# Patient Record
Sex: Female | Born: 1955 | Race: White | Hispanic: No | Smoking: Never smoker
Health system: Southern US, Community
[De-identification: ages and names within clinical notes are randomized; demographics above are authoritative.]

## PROBLEM LIST (undated history)

## (undated) DIAGNOSIS — I10 Essential (primary) hypertension: Secondary | ICD-10-CM

## (undated) DIAGNOSIS — K219 Gastro-esophageal reflux disease without esophagitis: Secondary | ICD-10-CM

## (undated) DIAGNOSIS — J45909 Unspecified asthma, uncomplicated: Secondary | ICD-10-CM

## (undated) DIAGNOSIS — C801 Malignant (primary) neoplasm, unspecified: Secondary | ICD-10-CM

## (undated) HISTORY — PX: CYST REMOVAL NECK: SHX6281

---

## 2007-08-29 DIAGNOSIS — C801 Malignant (primary) neoplasm, unspecified: Secondary | ICD-10-CM

## 2007-08-29 HISTORY — DX: Malignant (primary) neoplasm, unspecified: C80.1

## 2007-09-09 HISTORY — PX: BREAST LUMPECTOMY: SHX2

## 2017-11-22 ENCOUNTER — Other Ambulatory Visit: Payer: Self-pay | Admitting: Family Medicine

## 2017-11-22 DIAGNOSIS — Z139 Encounter for screening, unspecified: Secondary | ICD-10-CM

## 2017-12-05 ENCOUNTER — Other Ambulatory Visit: Payer: Self-pay | Admitting: Family Medicine

## 2017-12-05 DIAGNOSIS — Z139 Encounter for screening, unspecified: Secondary | ICD-10-CM

## 2017-12-26 ENCOUNTER — Ambulatory Visit
Admission: RE | Admit: 2017-12-26 | Discharge: 2017-12-26 | Disposition: A | Discharge status: Home / Self Care | Payer: No Typology Code available for payment source | Source: Ambulatory Visit | Attending: Family Medicine | Admitting: Family Medicine

## 2017-12-26 DIAGNOSIS — Z139 Encounter for screening, unspecified: Secondary | ICD-10-CM

## 2017-12-26 HISTORY — DX: Malignant (primary) neoplasm, unspecified: C80.1

## 2018-11-11 ENCOUNTER — Other Ambulatory Visit: Payer: Self-pay | Admitting: Family Medicine

## 2018-11-11 DIAGNOSIS — N6310 Unspecified lump in the right breast, unspecified quadrant: Secondary | ICD-10-CM

## 2018-11-12 ENCOUNTER — Other Ambulatory Visit (HOSPITAL_COMMUNITY): Payer: Self-pay | Admitting: *Deleted

## 2018-11-12 DIAGNOSIS — N6310 Unspecified lump in the right breast, unspecified quadrant: Secondary | ICD-10-CM

## 2018-11-12 DIAGNOSIS — Z853 Personal history of malignant neoplasm of breast: Secondary | ICD-10-CM

## 2018-11-20 ENCOUNTER — Other Ambulatory Visit (HOSPITAL_COMMUNITY): Payer: Self-pay | Admitting: *Deleted

## 2018-11-20 ENCOUNTER — Encounter (HOSPITAL_COMMUNITY): Payer: Self-pay | Admitting: *Deleted

## 2018-11-20 ENCOUNTER — Ambulatory Visit (HOSPITAL_COMMUNITY)
Admission: RE | Admit: 2018-11-20 | Discharge: 2018-11-20 | Disposition: A | Discharge status: Home / Self Care | Payer: Self-pay | Source: Ambulatory Visit | Attending: Obstetrics and Gynecology | Admitting: Obstetrics and Gynecology

## 2018-11-20 VITALS — BP 130/82 | Wt 169.0 lb

## 2018-11-20 DIAGNOSIS — N6320 Unspecified lump in the left breast, unspecified quadrant: Secondary | ICD-10-CM

## 2018-11-20 DIAGNOSIS — N6310 Unspecified lump in the right breast, unspecified quadrant: Secondary | ICD-10-CM

## 2018-11-20 DIAGNOSIS — N6315 Unspecified lump in the right breast, overlapping quadrants: Secondary | ICD-10-CM

## 2018-11-20 DIAGNOSIS — Z01419 Encounter for gynecological examination (general) (routine) without abnormal findings: Secondary | ICD-10-CM

## 2018-11-20 HISTORY — DX: Essential (primary) hypertension: I10

## 2018-11-20 NOTE — Progress Notes (Addendum)
Complaints of a right breast lump since December 2019 that is painful when touched. Patient rates the pain at a 1 out of 10. Patient has a history of left breast cancer and a partial left breast mastectomy in 2009.  Pap Smear: Pap smear completed today. Last Pap smear was in 2016 in [place] and normal per patient. Per patient has no history of an abnormal Pap smear. No Pap smear results are in Epic.  Physical exam: Breasts Right breast larger than left breast due to history of partial mastectomy in 2009. No skin abnormalities right breast. Scar observed right breast from history of left breast partial mastectomy. No nipple retraction bilateral breasts. No nipple discharge bilateral breasts. No lymphadenopathy. Palpated a lump within the left breast under nipple. Palpated a lump within the right breast at 9 o'clock 3 cm from the nipple. Complaints of tenderness when palpated lump within the right breast. Referred patient to the [facility] for a diagnostic mammogram and bilateral breast ultrasound. Appointment scheduled for Friday, November 21, 2018 at 0910.        Pelvic/Bimanual   Ext Genitalia No lesions, no swelling and no discharge observed on external genitalia.         Vagina Vagina pink and normal texture. No lesions or discharge observed in vagina.          Cervix Cervix is present. Cervix pink and of normal texture. No discharge observed.     Uterus Uterus is present and palpable. Uterus is prolapsed and normal size.        Adnexae Bilateral ovaries present and palpable. No tenderness on palpation.         Rectovaginal No rectal exam completed today since patient had no rectal complaints. No skin abnormalities observed on exam.    Smoking History: Patient has never smoked.  Patient Navigation: Patient education provided. Access to services provided for patient through [facility].   Colorectal Cancer Screening: Per patient had a colonoscopy completed 8  years ago. No complaints today. FIT Test given to patient to complete and return to [facility].  Breast and Cervical Cancer Risk Assessment: Patient has a family history of her mother and a maternal aunt having breast cancer. Patient has a personal history of breast cancer in 2009 that a left breast partial mastectomy, radiation, and tamoxifen were completed for treatment. Patient has no known genetic mutations or history radiation treatment to the chest before age 29. Patient has no history of cervical dysplasia, immunocompromised, or DES exposure in-utero.  Risk Assessment    Risk Scores      11/20/2018   Last edited by: Armond Hang, LPN   5-year risk: 4.6 %   Lifetime risk: 18.5 %

## 2018-11-20 NOTE — Addendum Note (Signed)
Encounter addended by: Loletta Parish, RN on: 11/20/2018 4:16 PM  Actions taken: Clinical Note Signed

## 2018-11-20 NOTE — Addendum Note (Signed)
Encounter addended by: Loletta Parish, RN on: 11/20/2018 2:14 PM  Actions taken: Clinical Note Signed

## 2018-11-20 NOTE — Patient Instructions (Addendum)
Explained breast self awareness with [name]. Let patient know [facility] will cover Pap smears and HPV typing every 5 years unless has a history of abnormal Pap smears. Referred patient to the [facility] for a diagnostic mammogram and bilateral breast ultrasound. Appointment scheduled for Friday, November 21, 2018 at 0910. Patient aware of appointment and will be there. Let patient know will follow up with her within the next couple weeks with results of Pap smear by letter or phone. Gloria Mitchell verbalized understanding.  Brannock, Arvil Chaco, RN 12:36 PM

## 2018-11-21 ENCOUNTER — Ambulatory Visit
Admission: RE | Admit: 2018-11-21 | Discharge: 2018-11-21 | Disposition: A | Discharge status: Home / Self Care | Payer: No Typology Code available for payment source | Source: Ambulatory Visit | Attending: Obstetrics and Gynecology | Admitting: Obstetrics and Gynecology

## 2018-11-21 ENCOUNTER — Other Ambulatory Visit (HOSPITAL_COMMUNITY): Payer: Self-pay | Admitting: Obstetrics and Gynecology

## 2018-11-21 DIAGNOSIS — N6310 Unspecified lump in the right breast, unspecified quadrant: Secondary | ICD-10-CM

## 2018-11-21 DIAGNOSIS — N6320 Unspecified lump in the left breast, unspecified quadrant: Secondary | ICD-10-CM

## 2018-11-21 DIAGNOSIS — Z853 Personal history of malignant neoplasm of breast: Secondary | ICD-10-CM

## 2018-11-23 ENCOUNTER — Other Ambulatory Visit: Payer: Self-pay

## 2018-11-25 LAB — CYTOLOGY - PAP
Diagnosis: NEGATIVE
HPV: NOT DETECTED

## 2018-12-01 ENCOUNTER — Ambulatory Visit
Admission: RE | Admit: 2018-12-01 | Discharge: 2018-12-01 | Disposition: A | Discharge status: Home / Self Care | Payer: No Typology Code available for payment source | Source: Ambulatory Visit | Attending: Obstetrics and Gynecology | Admitting: Obstetrics and Gynecology

## 2018-12-01 ENCOUNTER — Other Ambulatory Visit (HOSPITAL_COMMUNITY): Payer: Self-pay | Admitting: Obstetrics and Gynecology

## 2018-12-01 DIAGNOSIS — Z853 Personal history of malignant neoplasm of breast: Secondary | ICD-10-CM

## 2018-12-01 DIAGNOSIS — N6320 Unspecified lump in the left breast, unspecified quadrant: Secondary | ICD-10-CM

## 2018-12-01 DIAGNOSIS — N6310 Unspecified lump in the right breast, unspecified quadrant: Secondary | ICD-10-CM

## 2018-12-04 ENCOUNTER — Encounter: Payer: Self-pay | Admitting: *Deleted

## 2018-12-07 LAB — FECAL OCCULT BLOOD, IMMUNOCHEMICAL: FECAL OCCULT BLD: NEGATIVE

## 2018-12-08 ENCOUNTER — Encounter (HOSPITAL_COMMUNITY): Payer: Self-pay

## 2018-12-08 ENCOUNTER — Other Ambulatory Visit: Payer: Self-pay | Admitting: *Deleted

## 2018-12-08 DIAGNOSIS — Z170 Estrogen receptor positive status [ER+]: Principal | ICD-10-CM

## 2018-12-08 DIAGNOSIS — C50411 Malignant neoplasm of upper-outer quadrant of right female breast: Secondary | ICD-10-CM | POA: Insufficient documentation

## 2018-12-09 ENCOUNTER — Other Ambulatory Visit: Payer: Self-pay | Admitting: Genetics

## 2018-12-09 DIAGNOSIS — C50411 Malignant neoplasm of upper-outer quadrant of right female breast: Secondary | ICD-10-CM

## 2018-12-09 DIAGNOSIS — Z170 Estrogen receptor positive status [ER+]: Principal | ICD-10-CM

## 2018-12-09 NOTE — Progress Notes (Signed)
[facility]  [phone] [phone]     ID: Gloria Mitchell DOB: 1956/01/29  MR#: [id number]  NOM#:[id number]  Patient Care Team: Patient, No Pcp Per as PCP - General (General Practice) Gloria Bookbinder, MD as Consulting Physician (General Surgery) Gloria Mitchell, Gloria Dad, MD as Consulting Physician (Oncology) Gloria Gibson, MD as Attending Physician (Radiation Oncology) Gloria Germany, RN as Oncology Nurse Navigator Gloria Kaufmann, RN as Oncology Nurse Navigator OTHER MD:   CHIEF COMPLAINT: Estrogen receptor positive breast cancer   CURRENT TREATMENT: Awaiting definitive surgery   HISTORY OF CURRENT ILLNESS: Gloria Mitchell has a previous diagnosis of DCISin the left breast treated  in 2009 with a partial mastectomy, and radiation, then tamoxifen for 5 years.   More recently she presented with a palpable abnormality in the upper-outer right breast and in the upper-central left periareolar region. She underwent bilateral diagnostic mammography with tomography and bilateral breast ultrasonography at [facility] on 11/21/2018 showing: Breast Density Category C. In the right breast, examination demonstrates an oval circumscribed 4.4 cm mass over the upper outer region accounting for patient's palpable abnormality. Remainder of the right breast in unchanged. On physical exam, there is an oval 3.0 x 4.0 cm firm mobile mass over the 9:30 position of the right breast 7 cm from the nipple. Target ultrasound is performed, showing an oval predominately circumscribed isoechoic mass with parallel long axis at the 9:30 position of the right breast 7 cm from the nipple corresponding to patient's mammographic and palpable abnormality. This measures 1.9 x 3.3 x 4.3 cm. There is mild to moderate internal vascularity. Ultrasound of the right axilla is unremarkable.   Accordingly on 12/01/2018 she proceeded to biopsy of the right breast area in question. The pathology  from this procedure showed ([id number]): invasive ductal carcinoma, grade 1-2. Prognostic indicators significant for: estrogen receptor, 100% positive and progesterone receptor, 100% positive, both with strong staining intensity. Proliferation marker Ki67 at 10%. HER2 equivocal (2+) by immunohistochemistry but negative by fluorescent in situ hybridization with a signals ratio 1.23 and number per cell 1.85.   The same mammogram showed no definite focal abnormality over the upper central left periareolar region to correspond to patient's palpable abnormality. There are stable post lumpectomy changes over the upper-outer left breast. No focal abnormality over the upper central left periareolar region is palpated on physical exam. Ultrasound over the upper central left periareolar region demonstrates an oval hypoechoic mass at the 1 o'clock position 1 cm from the nipple measuring 0.4 x 0.6 x 0.6 cm with possible intraductal extension. There is subtle border irregularity. Ultrasound of the left axilla is normal.   She also proceeded to biopsy of the left breast area in question on the same day. The pathology from this procedure showed ([id number]): fibrocystic changes to include stromal fibrosis.    The patient's subsequent history is as detailed below.   INTERVAL HISTORY: Gloria Mitchell was evaluated in the multidisciplinary breast cancer clinic on 12/10/2018 accompanied by her sister, Gloria Mitchell. Her case was also presented at the multidisciplinary breast cancer conference on the same day. At that time a preliminary plan was proposed: Genetics testing, definitive surgery, Oncotype and consideration of adjuvant radiation   REVIEW OF SYSTEMS: The patient denies unusual headaches, visual changes, nausea, vomiting, stiff neck, dizziness, or gait imbalance. There has been no cough, phlegm production, or pleurisy, no chest pain or pressure, and no change in bowel or bladder habits. The patient denies fever, rash,  bleeding, unexplained fatigue  or unexplained weight loss. A detailed review of systems was otherwise entirely negative.   PAST MEDICAL HISTORY: Past Medical History:  Diagnosis Date  . Cancer ([place]) 08/29/2007   Left DICS, stage 2  . Hypertension      PAST SURGICAL HISTORY: Past Surgical History:  Procedure Laterality Date  . BREAST LUMPECTOMY Left 09/09/2007  . CYST REMOVAL NECK       FAMILY HISTORY: Family History  Problem Relation Age of Onset  . Hypertension Mother   . Dementia Mother   . Breast cancer Mother        diagnosed in her 58's  . Breast cancer Maternal Aunt    As of February 2020, Gloria Mitchell's father is alive, but she is estranged from him. Gloria Mitchell has no information about her father or his family's medical information. Patients' mother is 17. The patient has 1 brother and 1 sister. The patient has a maternal aunt that was diagnosed with breast cancer at 64 years old. Patient denies anyone in her family having ovarian, prostate, or pancreatic cancer.    GYNECOLOGIC HISTORY:  No LMP recorded. Patient is postmenopausal. 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 She is widowed. She has no children. Gloria Mitchell lives with her mother. Gloria Mitchell does not attend a church, Glass blow[employer]designer, or mosque.   ADVANCED DIRECTIVES: Not in place. Gloria Mitchell was given the appropriate health care power of attorney forms to fill out and return at her convenience.     HEALTH MAINTENANCE: Social History   Tobacco Use  . Smoking status: Never Smoker  . Smokeless tobacco: Never Used  Substance Use Topics  . Alcohol use: Not Currently  . Drug use: Never    Colonoscopy: yes, 12 years ago in [place]  PAP: yes, 11/20/2018  Bone density: yes, unknown date and result   Allergies  Allergen Reactions  . Sulfa Antibiotics     Current Outpatient Medications  Medication Sig  Dispense Refill  . diphenhydrAMINE (BENADRYL) 25 mg capsule Take 25 mg by mouth as needed.    . hydrochlorothiazide (MICROZIDE) 12.5 MG capsule Take 12.5 mg by mouth daily.    Marland Kitchen lisinopril (PRINIVIL,ZESTRIL) 10 MG tablet Take 10 mg by mouth daily.    Marland Kitchen UNABLE TO FIND Zegerid OTC for heartburn PRN     No current facility-administered medications for this visit.      OBJECTIVE: Middle-aged white woman in no acute distress  Vitals:   12/10/18 0900  BP: 137/71  Pulse: 72  Resp: 18  Temp: 98.3 F (36.8 C)  SpO2: 100%     Body mass index is 27.15 kg/m.   Wt Readings from Last 3 Encounters:  12/10/18 168 lb 3.2 oz (76.3 kg)  11/20/18 169 lb (76.7 kg)      ECOG FS:0 - Asymptomatic  Ocular: Sclerae unicteric, pupils round and equal Ear-nose-throat: Oropharynx clear and moist Lymphatic: No cervical or supraclavicular adenopathy Lungs no rales or rhonchi Heart regular rate and rhythm Abd soft, nontender, positive bowel sounds MSK no focal spinal tenderness, no joint edema Neuro: non-focal, well-oriented, appropriate affect Breasts: Status post bilateral breast biopsies.  There is an easily palpable mass in the inferior aspect of the right breast measuring in excess of 3 cm.  There is no skin involvement.  Both axillae are benign.   LAB RESULTS:  CMP     Component Value  Date/Time   NA 142 12/10/2018 0820   K 4.1 12/10/2018 0820   CL 106 12/10/2018 0820   CO2 27 12/10/2018 0820   GLUCOSE 103 (H) 12/10/2018 0820   BUN 18 12/10/2018 0820   CREATININE 0.88 12/10/2018 0820   CALCIUM 9.3 12/10/2018 0820   PROT 7.2 12/10/2018 0820   ALBUMIN 3.9 12/10/2018 0820   AST 13 (L) 12/10/2018 0820   ALT 8 12/10/2018 0820   ALKPHOS 64 12/10/2018 0820   BILITOT 0.3 12/10/2018 0820   GFRNONAA >60 12/10/2018 0820   GFRAA >60 12/10/2018 0820    No results found for: TOTALPROTELP, ALBUMINELP, A1GS, A2GS, BETS, BETA2SER, GAMS, MSPIKE, SPEI  No results found for: KPAFRELGTCHN, LAMBDASER,  KAPLAMBRATIO  Lab Results  Component Value Date   WBC 6.0 12/10/2018   NEUTROABS 3.1 12/10/2018   HGB 12.1 12/10/2018   HCT 38.2 12/10/2018   MCV 93.2 12/10/2018   PLT 288 12/10/2018    '@LASTCHEMISTRY' @  No results found for: LABCA2  No components found for: TRVUYE334  No results for input(s): INR in the last 168 hours.  No results found for: LABCA2  No results found for: DHW861  No results found for: UOH729  No results found for: MSX115  No results found for: CA2729  No components found for: HGQUANT  No results found for: CEA1 / No results found for: CEA1   No results found for: AFPTUMOR  No results found for: CHROMOGRNA  No results found for: PSA1  Appointment on 12/10/2018  Component Date Value Ref Range Status  . Sodium 12/10/2018 142  135 - 145 mmol/L Final  . Potassium 12/10/2018 4.1  3.5 - 5.1 mmol/L Final  . Chloride 12/10/2018 106  98 - 111 mmol/L Final  . CO2 12/10/2018 27  22 - 32 mmol/L Final  . Glucose, Bld 12/10/2018 103* 70 - 99 mg/dL Final  . BUN 12/10/2018 18  8 - 23 mg/dL Final  . Creatinine 12/10/2018 0.88  0.44 - 1.00 mg/dL Final  . Calcium 12/10/2018 9.3  8.9 - 10.3 mg/dL Final  . Total Protein 12/10/2018 7.2  6.5 - 8.1 g/dL Final  . Albumin 12/10/2018 3.9  3.5 - 5.0 g/dL Final  . AST 12/10/2018 13* 15 - 41 U/L Final  . ALT 12/10/2018 8  0 - 44 U/L Final  . Alkaline Phosphatase 12/10/2018 64  38 - 126 U/L Final  . Total Bilirubin 12/10/2018 0.3  0.3 - 1.2 mg/dL Final  . GFR, Est Non Af Am 12/10/2018 >60  >60 mL/min Final  . GFR, Est AFR Am 12/10/2018 >60  >60 mL/min Final  . Anion gap 12/10/2018 9  5 - 15 Final   Performed at [facility], [place] [address] [place], [place] [address]  . WBC Count 12/10/2018 6.0  4.0 - 10.5 K/uL Final  . RBC 12/10/2018 4.10  3.87 - 5.11 MIL/uL Final  . Hemoglobin 12/10/2018 12.1  12.0 - 15.0 g/dL Final  . HCT 12/10/2018 38.2  36.0 - 46.0 % Final  . MCV 12/10/2018 93.2  80.0 -  100.0 fL Final  . MCH 12/10/2018 29.5  26.0 - 34.0 pg Final  . MCHC 12/10/2018 31.7  30.0 - 36.0 g/dL Final  . RDW 12/10/2018 12.9  11.5 - 15.5 % Final  . Platelet Count 12/10/2018 288  150 - 400 K/uL Final  . nRBC 12/10/2018 0.0  0.0 - 0.2 % Final  . Neutrophils Relative % 12/10/2018 50  % Final  . Neutro  Abs 12/10/2018 3.1  1.7 - 7.7 K/uL Final  . Lymphocytes Relative 12/10/2018 37  % Final  . Lymphs Abs 12/10/2018 2.2  0.7 - 4.0 K/uL Final  . Monocytes Relative 12/10/2018 8  % Final  . Monocytes Absolute 12/10/2018 0.5  0.1 - 1.0 K/uL Final  . Eosinophils Relative 12/10/2018 4  % Final  . Eosinophils Absolute 12/10/2018 0.2  0.0 - 0.5 K/uL Final  . Basophils Relative 12/10/2018 1  % Final  . Basophils Absolute 12/10/2018 0.0  0.0 - 0.1 K/uL Final  . Immature Granulocytes 12/10/2018 0  % Final  . Abs Immature Granulocytes 12/10/2018 0.01  0.00 - 0.07 K/uL Final   Performed at [facility], [place] [address] [place], [place] [address]    (this displays the last labs from the last 3 days)  No results found for: TOTALPROTELP, ALBUMINELP, A1GS, A2GS, BETS, BETA2SER, GAMS, MSPIKE, SPEI (this displays SPEP labs)  No results found for: KPAFRELGTCHN, LAMBDASER, KAPLAMBRATIO (kappa/lambda light chains)  No results found for: HGBA, HGBA2QUANT, HGBFQUANT, HGBSQUAN (Hemoglobinopathy evaluation)   No results found for: LDH  No results found for: IRON, TIBC, IRONPCTSAT (Iron and TIBC)  No results found for: FERRITIN  Urinalysis No results found for: COLORURINE, APPEARANCEUR, LABSPEC, PHURINE, GLUCOSEU, HGBUR, BILIRUBINUR, KETONESUR, PROTEINUR, UROBILINOGEN, NITRITE, LEUKOCYTESUR   STUDIES:  US Breast Ltd Uni Left Inc Axilla  Result Date: 11/21/2018 CLINICAL DATA:  Patient presents for a bilateral diagnostic examination due to a palpable abnormality for 1-2 months over the upper-outer right breast. Patient's healthcare provider states a palpable abnormality over  the upper central left periareolar region. Patient has a history of a previous left malignant lumpectomy 2008. Positive family history breast cancer in her mother diagnosed in her 91s as well as maternal aunt. No genetic testing done previously. EXAM: DIGITAL DIAGNOSTIC bilateral MAMMOGRAM WITH CAD AND TOMO ULTRASOUND bilateral BREAST COMPARISON:  Previous exam(s). ACR Breast Density Category c: The breast tissue is heterogeneously dense, which may obscure small masses. FINDINGS: Examination demonstrates an oval circumscribed 4.4 cm mass over the upper outer right breast accounting for patient's palpable abnormality. Remainder of the right breast is unchanged. There is no definite focal abnormality over the upper central left periareolar region to correspond to patient's palpable abnormality. There are stable post lumpectomy changes over the upper-outer left breast. Mammographic images were processed with CAD. On physical exam, there is an oval 3 x 4 cm firm mobile mass over the 9:30 position of the right breast 7 cm from the nipple. I palpate no focal abnormality over the upper central left periareolar region. Targeted ultrasound is performed, showing an oval predominately circumscribed isoechoic mass with parallel long axis at the 9:30 position of the right breast 7 cm from the nipple corresponding to patient's mammographic and palpable abnormality. This measures 1.9 x 3.3 x 4.3 cm. There is mild to moderate internal vascularity. Ultrasound the right axilla is unremarkable. Ultrasound over the upper central left periareolar region demonstrates an oval hypoechoic mass at the 1 o'clock position 1 cm from the nipple measuring 4 x 6 x 6 mm with possible intraductal extension. There is subtle border irregularity. Ultrasound of the left axilla is normal. IMPRESSION: 1. Indeterminate 4.3 cm mass over the 9:30 position of the right breast 7 cm from the nipple corresponding to patient's palpable abnormality. 2.  Indeterminate 6 mm mass over the 1 o'clock position of the left breast 1 cm from the nipple. RECOMMENDATION: Recommend ultrasound-guided Mitchell needle biopsy of these  bilateral indeterminate masses. I have discussed the findings and recommendations with the patient. Results were also provided in writing at the conclusion of the visit. If applicable, a reminder letter will be sent to the patient regarding the next appointment. BI-RADS CATEGORY  4: Suspicious. Biopsy will be scheduled here at the [facility] prior to patient's departure. Electronically Signed   By: Marin Olp M.D.   On: 11/21/2018 11:09   US Breast Ltd Uni Right Inc Axilla  Result Date: 11/21/2018 CLINICAL DATA:  Patient presents for a bilateral diagnostic examination due to a palpable abnormality for 1-2 months over the upper-outer right breast. Patient's healthcare provider states a palpable abnormality over the upper central left periareolar region. Patient has a history of a previous left malignant lumpectomy 2008. Positive family history breast cancer in her mother diagnosed in her 17s as well as maternal aunt. No genetic testing done previously. EXAM: DIGITAL DIAGNOSTIC bilateral MAMMOGRAM WITH CAD AND TOMO ULTRASOUND bilateral BREAST COMPARISON:  Previous exam(s). ACR Breast Density Category c: The breast tissue is heterogeneously dense, which may obscure small masses. FINDINGS: Examination demonstrates an oval circumscribed 4.4 cm mass over the upper outer right breast accounting for patient's palpable abnormality. Remainder of the right breast is unchanged. There is no definite focal abnormality over the upper central left periareolar region to correspond to patient's palpable abnormality. There are stable post lumpectomy changes over the upper-outer left breast. Mammographic images were processed with CAD. On physical exam, there is an oval 3 x 4 cm firm mobile mass over the 9:30 position of the right breast 7 cm from the nipple. I  palpate no focal abnormality over the upper central left periareolar region. Targeted ultrasound is performed, showing an oval predominately circumscribed isoechoic mass with parallel long axis at the 9:30 position of the right breast 7 cm from the nipple corresponding to patient's mammographic and palpable abnormality. This measures 1.9 x 3.3 x 4.3 cm. There is mild to moderate internal vascularity. Ultrasound the right axilla is unremarkable. Ultrasound over the upper central left periareolar region demonstrates an oval hypoechoic mass at the 1 o'clock position 1 cm from the nipple measuring 4 x 6 x 6 mm with possible intraductal extension. There is subtle border irregularity. Ultrasound of the left axilla is normal. IMPRESSION: 1. Indeterminate 4.3 cm mass over the 9:30 position of the right breast 7 cm from the nipple corresponding to patient's palpable abnormality. 2. Indeterminate 6 mm mass over the 1 o'clock position of the left breast 1 cm from the nipple. RECOMMENDATION: Recommend ultrasound-guided Mitchell needle biopsy of these bilateral indeterminate masses. I have discussed the findings and recommendations with the patient. Results were also provided in writing at the conclusion of the visit. If applicable, a reminder letter will be sent to the patient regarding the next appointment. BI-RADS CATEGORY  4: Suspicious. Biopsy will be scheduled here at the [facility] prior to patient's departure. Electronically Signed   By: Marin Olp M.D.   On: 11/21/2018 11:09   Ms Digital Diag Tomo Bilat  Result Date: 11/21/2018 CLINICAL DATA:  Patient presents for a bilateral diagnostic examination due to a palpable abnormality for 1-2 months over the upper-outer right breast. Patient's healthcare provider states a palpable abnormality over the upper central left periareolar region. Patient has a history of a previous left malignant lumpectomy 2008. Positive family history breast cancer in her mother diagnosed in  her 70s as well as maternal aunt. No genetic testing done previously. EXAM: DIGITAL DIAGNOSTIC  bilateral MAMMOGRAM WITH CAD AND TOMO ULTRASOUND bilateral BREAST COMPARISON:  Previous exam(s). ACR Breast Density Category c: The breast tissue is heterogeneously dense, which may obscure small masses. FINDINGS: Examination demonstrates an oval circumscribed 4.4 cm mass over the upper outer right breast accounting for patient's palpable abnormality. Remainder of the right breast is unchanged. There is no definite focal abnormality over the upper central left periareolar region to correspond to patient's palpable abnormality. There are stable post lumpectomy changes over the upper-outer left breast. Mammographic images were processed with CAD. On physical exam, there is an oval 3 x 4 cm firm mobile mass over the 9:30 position of the right breast 7 cm from the nipple. I palpate no focal abnormality over the upper central left periareolar region. Targeted ultrasound is performed, showing an oval predominately circumscribed isoechoic mass with parallel long axis at the 9:30 position of the right breast 7 cm from the nipple corresponding to patient's mammographic and palpable abnormality. This measures 1.9 x 3.3 x 4.3 cm. There is mild to moderate internal vascularity. Ultrasound the right axilla is unremarkable. Ultrasound over the upper central left periareolar region demonstrates an oval hypoechoic mass at the 1 o'clock position 1 cm from the nipple measuring 4 x 6 x 6 mm with possible intraductal extension. There is subtle border irregularity. Ultrasound of the left axilla is normal. IMPRESSION: 1. Indeterminate 4.3 cm mass over the 9:30 position of the right breast 7 cm from the nipple corresponding to patient's palpable abnormality. 2. Indeterminate 6 mm mass over the 1 o'clock position of the left breast 1 cm from the nipple. RECOMMENDATION: Recommend ultrasound-guided Mitchell needle biopsy of these bilateral  indeterminate masses. I have discussed the findings and recommendations with the patient. Results were also provided in writing at the conclusion of the visit. If applicable, a reminder letter will be sent to the patient regarding the next appointment. BI-RADS CATEGORY  4: Suspicious. Biopsy will be scheduled here at the [facility] prior to patient's departure. Electronically Signed   By: Marin Olp M.D.   On: 11/21/2018 11:09   Mm Clip Placement Left  Result Date: 12/01/2018 CLINICAL DATA:  Status post ultrasound-guided Mitchell biopsy of left breast mass EXAM: DIAGNOSTIC LEFT MAMMOGRAM POST ULTRASOUND BIOPSY COMPARISON:  Previous exam(s). FINDINGS: Mammographic images were obtained following left breast ultrasound guided biopsy of question intraductal mass at 1 o'clock. Cc and lateral views of the left breast demonstrate ribbon biopsy clip in the area of concern. IMPRESSION: Post biopsy mammogram demonstrating biopsy clip in the area of concern. Final Assessment: Post Procedure Mammograms for Marker Placement Electronically Signed   By: Abelardo Diesel M.D.   On: 12/01/2018 09:13   Mm Clip Placement Right  Result Date: 12/01/2018 CLINICAL DATA:  Status post ultrasound-guided Mitchell biopsy of right breast mass EXAM: DIAGNOSTIC RIGHT MAMMOGRAM POST ULTRASOUND BIOPSY COMPARISON:  Previous exam(s). FINDINGS: Mammographic images were obtained following right breast ultrasound guided biopsy of mass at 9:30 o'clock. Cc and lateral views of the right breast demonstrate ribbon biopsy clip in the mass of concern. IMPRESSION: Post biopsy mammogram demonstrating ribbon biopsy clip in the mass of concern. Final Assessment: Post Procedure Mammograms for Marker Placement Electronically Signed   By: Abelardo Diesel M.D.   On: 12/01/2018 09:14   [place] Lt Breast Bx W Loc Dev 1st Lesion Img Bx Spec US Guide  Addendum Date: 12/03/2018   ADDENDUM REPORT: 12/03/2018 09:03 ADDENDUM: Pathology revealed FIBROCYSTIC CHANGES TO INCLUDE  STROMAL FIBROSIS of the LEFT breast,  1 o'clock. This was found to be discordant by Dr. Abelardo Diesel, with excision recommended. Pathology revealed GRADE I-II INVASIVE DUCTAL CARCINOMA of the RIGHT breast, 9:30 o'clock. This was found to be concordant by Dr. Abelardo Diesel. Pathology results were discussed with the patient by telephone. The patient reported doing well after the biopsies with tenderness at the sites. Post biopsy instructions and care were reviewed and questions were answered. The patient was encouraged to call [facility] for any additional concerns. The patient was referred to [facility] on December 10, 2018. Pathology results reported by Terie Purser, RN on 12/03/2018. Electronically Signed   By: Abelardo Diesel M.D.   On: 12/03/2018 09:03   Result Date: 12/03/2018 CLINICAL DATA:  Left breast mass for biopsy EXAM: ULTRASOUND GUIDED LEFT BREAST Mitchell NEEDLE BIOPSY COMPARISON:  Previous exam(s). FINDINGS: I met with the patient and we discussed the procedure of ultrasound-guided biopsy, including benefits and alternatives. We discussed the high likelihood of a successful procedure. We discussed the risks of the procedure, including infection, bleeding, tissue injury, clip migration, and inadequate sampling. Informed written consent was given. The usual time-out protocol was performed immediately prior to the procedure. Lesion quadrant: Upper outer quadrant Using sterile technique and 1% Lidocaine as local anesthetic, under direct ultrasound visualization, a 14 gauge spring-loaded device was used to perform biopsy of question intraductal mass at the left breast 1 o'clock using a lateral approach. At the conclusion of the procedure a ribbon tissue marker clip was deployed into the biopsy cavity. Follow up 2 view mammogram was performed and dictated separately. IMPRESSION: Ultrasound guided biopsy of left  breast.  No apparent complications. Electronically Signed: By: Abelardo Diesel M.D. On: 12/01/2018 09:12   [place] Rt Breast Bx W Loc Dev 1st Lesion Img Bx Spec US Guide  Result Date: 12/01/2018 CLINICAL DATA:  Right breast mass for biopsy EXAM: ULTRASOUND GUIDED RIGHT BREAST Mitchell NEEDLE BIOPSY COMPARISON:  Previous exam(s). FINDINGS: I met with the patient and we discussed the procedure of ultrasound-guided biopsy, including benefits and alternatives. We discussed the high likelihood of a successful procedure. We discussed the risks of the procedure, including infection, bleeding, tissue injury, clip migration, and inadequate sampling. Informed written consent was given. The usual time-out protocol was performed immediately prior to the procedure. Lesion quadrant: Upper outer quadrant Using sterile technique and 1% Lidocaine as local anesthetic, under direct ultrasound visualization, a 12 gauge spring-loaded device was used to perform biopsy of homogeneous mass at the right breast 9:30 o'clock using a lateral approach. At the conclusion of the procedure a ribbon tissue marker clip was deployed into the biopsy cavity. Follow up 2 view mammogram was performed and dictated separately. IMPRESSION: Ultrasound guided biopsy of right breast. No apparent complications. Electronically Signed   By: Abelardo Diesel M.D.   On: 12/01/2018 09:11     ELIGIBLE FOR AVAILABLE RESEARCH PROTOCOL: NO   ASSESSMENT: 63 y.o. 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 Specifically we reviewed the biology of the patient's diagnosis and the specifics of her situation.  We first reviewed the fact that cancer is not one disease but more than 100 different diseases and that it is important to keep them separate-- otherwise when friends and relatives discuss their own cancer experiences with [place] confusion can result. Similarly we explained that if breast cancer spreads to the bone or liver, the patient would not have bone cancer or liver cancer, but breast cancer in the bone and breast cancer in the liver: one cancer in three places-- not 3 different cancers which otherwise would have to be treated in 3 different ways.  We discussed the difference between local and systemic therapy. In terms of loco-regional treatment, lumpectomy plus radiation is equivalent to mastectomy as far as survival is concerned. For this reason, and because the cosmetic results are generally superior, we usually recommend breast conserving surgery.  However in Schyler's case it would be difficult to do an adequate resection of the right breast with good cosmesis.  The patient has decided that which she would really prefer is bilateral mastectomies with right-sided sentinel lymph node sampling and that will be the plan.  We then discussed the rationale for systemic therapy. There is some risk that this cancer may have already spread to other parts of her body. Patients frequently ask at this point about bone scans, CAT scans and PET scans to find out if they have occult breast cancer somewhere else. The problem is that in early stage disease we are much more likely to find false positives then true cancers and this would expose the patient to unnecessary procedures as well as unnecessary radiation. Scans cannot answer the  question the patient really would like to know, which is whether she has microscopic disease elsewhere in her body. For those reasons we do not recommend them.  Of course we would proceed to aggressive evaluation of any symptoms that might suggest metastatic disease, but that is not the case here.  Next we went over the options for systemic therapy which are anti-estrogens, anti-HER-2 immunotherapy, and chemotherapy. Vincie does not meet criteria for anti-HER-2 immunotherapy. She is a good candidate for anti-estrogens.  The question of chemotherapy is more complicated. Chemotherapy is most effective in rapidly growing, aggressive tumors. It is much less effective in low-grade, slow growing cancers, like Palak 's. For that reason we are going to request an Oncotype from the definitive surgical sample, as suggested by NCCN guidelines. That will help [place] make a definitive decision regarding chemotherapy in this case.  [place] also qualifies for genetics testing. In patients who carry a deleterious mutation [for example in a  BRCA gene], the risk of a new breast cancer developing in the future may be sufficiently great that the patient may choose bilateral mastectomies. However if she wishes to keep her breasts in that situation it is safe to do so. That would require intensified screening, which generally means not only yearly mammography but a yearly breast MRI as well. Of course, if there is a deleterious mutation bilateral oophorectomy would be necessary as there is no standard screening protocol for ovarian cancer.  The overall plan then is for genetics testing, bilateral mastectomies with no reconstruction, sentinel lymph nodes on the right side, Oncotype testing, and consideration regarding adjuvant radiation depending  on final surgical results.  Once the local therapy has been completed she will start antiestrogens.  Pakou has a good understanding of the overall plan. She agrees with it. She knows the  goal of treatment in her case is cure. She will call with any problems that may develop before her next visit here.   Ann Groeneveld, Gloria Dad, MD  12/10/18 4:52 PM Medical Oncology and Hematology [facility] [address] [place], [place] [address] Tel. [phone]    Fax. [phone]    I, Jacqualyn Posey am acting as a Education administrator for Chauncey Cruel, MD.   I, Lurline Del MD, have reviewed the above documentation for accuracy and completeness, and I agree with the above.

## 2018-12-10 ENCOUNTER — Encounter: Payer: Self-pay | Admitting: Radiation Oncology

## 2018-12-10 ENCOUNTER — Other Ambulatory Visit: Payer: Self-pay | Admitting: General Surgery

## 2018-12-10 ENCOUNTER — Ambulatory Visit
Admission: RE | Admit: 2018-12-10 | Discharge: 2018-12-10 | Disposition: A | Discharge status: Home / Self Care | Payer: Self-pay | Source: Ambulatory Visit | Attending: Radiation Oncology | Admitting: Radiation Oncology

## 2018-12-10 ENCOUNTER — Inpatient Hospital Stay: Payer: Medicaid Other | Attending: Oncology | Admitting: Oncology

## 2018-12-10 ENCOUNTER — Inpatient Hospital Stay: Payer: Medicaid Other

## 2018-12-10 ENCOUNTER — Encounter: Payer: Self-pay | Admitting: Oncology

## 2018-12-10 ENCOUNTER — Other Ambulatory Visit: Payer: Self-pay

## 2018-12-10 ENCOUNTER — Encounter: Payer: Self-pay | Admitting: Physical Therapy

## 2018-12-10 ENCOUNTER — Ambulatory Visit: Payer: Medicaid Other | Attending: General Surgery | Admitting: Physical Therapy

## 2018-12-10 ENCOUNTER — Encounter: Payer: Self-pay | Admitting: *Deleted

## 2018-12-10 VITALS — BP 137/71 | HR 72 | Temp 98.3°F | Resp 18 | Ht 66.0 in | Wt 168.2 lb

## 2018-12-10 DIAGNOSIS — C50411 Malignant neoplasm of upper-outer quadrant of right female breast: Secondary | ICD-10-CM | POA: Diagnosis present

## 2018-12-10 DIAGNOSIS — I10 Essential (primary) hypertension: Secondary | ICD-10-CM | POA: Diagnosis not present

## 2018-12-10 DIAGNOSIS — Z170 Estrogen receptor positive status [ER+]: Principal | ICD-10-CM

## 2018-12-10 DIAGNOSIS — R293 Abnormal posture: Secondary | ICD-10-CM

## 2018-12-10 DIAGNOSIS — Z9011 Acquired absence of right breast and nipple: Secondary | ICD-10-CM | POA: Diagnosis not present

## 2018-12-10 DIAGNOSIS — Z86000 Personal history of in-situ neoplasm of breast: Secondary | ICD-10-CM | POA: Diagnosis not present

## 2018-12-10 LAB — CMP (CANCER CENTER ONLY)
ALT: 8 U/L (ref 0–44)
ANION GAP: 9 (ref 5–15)
AST: 13 U/L — ABNORMAL LOW (ref 15–41)
Albumin: 3.9 g/dL (ref 3.5–5.0)
Alkaline Phosphatase: 64 U/L (ref 38–126)
BUN: 18 mg/dL (ref 8–23)
CO2: 27 mmol/L (ref 22–32)
Calcium: 9.3 mg/dL (ref 8.9–10.3)
Chloride: 106 mmol/L (ref 98–111)
Creatinine: 0.88 mg/dL (ref 0.44–1.00)
GFR, Estimated: >60 mL/min (ref >60)
Glucose, Bld: 103 mg/dL — ABNORMAL HIGH (ref 70–99)
Potassium: 4.1 mmol/L (ref 3.5–5.1)
Sodium: 142 mmol/L (ref 135–145)
TOTAL PROTEIN: 7.2 g/dL (ref 6.5–8.1)
Total Bilirubin: 0.3 mg/dL (ref 0.3–1.2)

## 2018-12-10 LAB — CBC WITH DIFFERENTIAL (CANCER CENTER ONLY)
Abs Immature Granulocytes: 0.01 10*3/uL (ref 0.00–0.07)
Basophils Absolute: 0 10*3/uL (ref 0.0–0.1)
Basophils Relative: 1 %
Eosinophils Absolute: 0.2 10*3/uL (ref 0.0–0.5)
Eosinophils Relative: 4 %
HCT: 38.2 % (ref 36.0–46.0)
Hemoglobin: 12.1 g/dL (ref 12.0–15.0)
Immature Granulocytes: 0 %
Lymphocytes Relative: 37 %
Lymphs Abs: 2.2 10*3/uL (ref 0.7–4.0)
MCH: 29.5 pg (ref 26.0–34.0)
MCHC: 31.7 g/dL (ref 30.0–36.0)
MCV: 93.2 fL (ref 80.0–100.0)
Monocytes Absolute: 0.5 10*3/uL (ref 0.1–1.0)
Monocytes Relative: 8 %
Neutro Abs: 3.1 10*3/uL (ref 1.7–7.7)
Neutrophils Relative %: 50 %
Platelet Count: 288 10*3/uL (ref 150–400)
RBC: 4.1 MIL/uL (ref 3.87–5.11)
RDW: 12.9 % (ref 11.5–15.5)
WBC Count: 6 10*3/uL (ref 4.0–10.5)
nRBC: 0 % (ref 0.0–0.2)

## 2018-12-10 NOTE — Progress Notes (Signed)
Clinical Social Work [place] Psychosocial Distress Screening [place]  Patient completed distress screening protocol and scored a 1 on the Psychosocial Distress Thermometer which indicates mild distress. Clinical Social Worker met with patient and patients sister in [facility] to assess for distress and other psychosocial needs. Patient stated she was felt positive after meeting with the treatment team and getting more information on her treatment plan. CSW and patient discussed common feeling and emotions when being diagnosed with cancer, and the importance of support during treatment. CSW informed patient of the support team and support services at [facility]. CSW provided contact information and encouraged patient to call with any questions or concerns.  ON[facility] DISTRESS SCREENING 12/10/2018  Screening Type Initial Screening  Distress experienced in past week (1-10) 1     Gloria Mitchell, MSW, LCSW, OSW-C Clinical Social Worker [place] [phone]

## 2018-12-10 NOTE — Progress Notes (Signed)
Radiation Oncology         ([phone]) [phone] ________________________________  Initial inpatient Consultation  Name: Gloria Mitchell MRN: [id number]  Date: 12/10/2018  DOB: Oct 08, 1956  WP:[id number], No Pcp Per  No ref. provider found   REFERRING PHYSICIAN: No ref. provider found  DIAGNOSIS:    ICD-10-CM   1. Malignant neoplasm of upper-outer quadrant of right breast in female, estrogen receptor positive ([place]) C50.411    Z17.0    Cancer Staging Malignant neoplasm of upper-outer quadrant of right breast in female, estrogen receptor positive ([place]) Staging form: Breast, AJCC 8th Edition - Clinical stage from 12/10/2018: Stage IB (cT2, cN0, cM0, G2, ER+, PR+, HER2-) - Unsigned   CHIEF COMPLAINT: Here to discuss management of right breast cancer  HISTORY OF PRESENT ILLNESS::Gloria Mitchell is a 63 y.o. female who presented with a palpable right breast mass laterally and then a breast abnormality on the following imaging: mammogram (first one in a while due to lacking health insurance). Ultrasound of breast on right side revealed 4.3cm mass at 9:30 and negative axilla.  Also, left breast, which is s/p lumpectomy several years ago due to breast cancer, showed a 37m mass at 1:00 that was suspicious.   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  ER status: +; PR status +, Her2 status neg; Grade 1-2. Biopsy of left breast showed fibrocystic changes which are felt to be discordant.  The patient reports that her left breast cancer was treated with lumpectomy and about 7 weeks of [place]  I am not sure about what systemic therapy if any she received.  Review of systems is positive for allergies, glasses, heartburn, skin cancer  PREVIOUS RADIATION THERAPY: Yes as above  PAST MEDICAL HISTORY:  has a past medical history of [place]) and Hypertension.    PAST SURGICAL HISTORY: Past Surgical History:  Procedure Laterality  Date  . BREAST LUMPECTOMY Left 09/09/2007  . CYST REMOVAL NECK      FAMILY HISTORY: family history includes Breast cancer in her maternal aunt and mother; Dementia in her mother; Hypertension in her mother.  SOCIAL HISTORY:  reports that she has never smoked. She has never used smokeless tobacco. She reports previous alcohol use. She reports that she does not use drugs.  ALLERGIES: Sulfa antibiotics  MEDICATIONS:  Current Outpatient Medications  Medication Sig Dispense Refill  . diphenhydrAMINE (BENADRYL) 25 mg capsule Take 25 mg by mouth as needed.    . hydrochlorothiazide (MICROZIDE) 12.5 MG capsule Take 12.5 mg by mouth daily.    .Marland Kitchenlisinopril (PRINIVIL,ZESTRIL) 10 MG tablet Take 10 mg by mouth daily.    .Marland KitchenUNABLE TO FIND Zegerid OTC for heartburn PRN     No current facility-administered medications for this encounter.     REVIEW OF SYSTEMS: A 10+ POINT REVIEW OF SYSTEMS WAS OBTAINED including neurology, dermatology, psychiatry, cardiac, respiratory, lymph, extremities, GI, GU, Musculoskeletal, constitutional, breasts, reproductive, HEENT.  All pertinent positives are noted in the HPI.  All others are negative.   PHYSICAL EXAM:  Vitals with BMI 12/10/2018  Height '5\' 6"'   Weight 168 lbs 3 oz  BMI 255.37 Systolic 1482 Diastolic 71  Pulse 72  Respirations 18   General: Alert and oriented, in no acute distress HEENT: Head is normocephalic. Extraocular movements are intact. Oropharynx is clear. Neck: Neck is supple, no palpable cervical or supraclavicular lymphadenopathy. Heart: Regular in rate and rhythm with no murmurs, rubs, or gallops. Chest:  Clear to auscultation bilaterally, with no rhonchi, wheezes, or rales. Abdomen: Soft, nontender, nondistended, with no rigidity or guarding. Extremities: No cyanosis or edema. Lymphatics: see Neck Exam Skin: No concerning lesions. Musculoskeletal: symmetric strength and muscle tone throughout. Neurologic: Cranial nerves II through XII  are grossly intact. No obvious focalities. Speech is fluent. Coordination is intact. Psychiatric: Judgment and insight are intact. Affect is appropriate. Breasts: 2 cm of thickening in the upper outer quadrant of the left breast.  There is a 5 cm mass at 9:00 of the right breast.. No other palpable masses appreciated in the breasts or axillae .  ECOG = 0  0 - Asymptomatic (Fully active, able to carry on all predisease activities without restriction)  1 - Symptomatic but completely ambulatory (Restricted in physically strenuous activity but ambulatory and able to carry out work of a light or sedentary nature. For example, light housework, office work)  2 - Symptomatic, <50% in bed during the day (Ambulatory and capable of all self care but unable to carry out any work activities. Up and about more than 50% of waking hours)  3 - Symptomatic, >50% in bed, but not bedbound (Capable of only limited self-care, confined to bed or chair 50% or more of waking hours)  4 - Bedbound (Completely disabled. Cannot carry on any self-care. Totally confined to bed or chair)  5 - Death   Eustace Pen MM, Creech RH, Tormey DC, et al. [phone]). "Toxicity and response criteria of the [facility]". [place] Oncol. 5 (6): 649-55   LABORATORY DATA:  Lab Results  Component Value Date   WBC 6.0 12/10/2018   HGB 12.1 12/10/2018   HCT 38.2 12/10/2018   MCV 93.2 12/10/2018   PLT 288 12/10/2018   CMP     Component Value Date/Time   NA 142 12/10/2018 0820   K 4.1 12/10/2018 0820   CL 106 12/10/2018 0820   CO2 27 12/10/2018 0820   GLUCOSE 103 (H) 12/10/2018 0820   BUN 18 12/10/2018 0820   CREATININE 0.88 12/10/2018 0820   CALCIUM 9.3 12/10/2018 0820   PROT 7.2 12/10/2018 0820   ALBUMIN 3.9 12/10/2018 0820   AST 13 (L) 12/10/2018 0820   ALT 8 12/10/2018 0820   ALKPHOS 64 12/10/2018 0820   BILITOT 0.3 12/10/2018 0820   GFRNONAA >60 12/10/2018 0820   GFRAA >60 12/10/2018 0820           RADIOGRAPHY: US Breast Ltd Uni Left Inc Axilla  Result Date: 11/21/2018 CLINICAL DATA:  Patient presents for a bilateral diagnostic examination due to a palpable abnormality for 1-2 months over the upper-outer right breast. Patient's healthcare provider states a palpable abnormality over the upper central left periareolar region. Patient has a history of a previous left malignant lumpectomy 2008. Positive family history breast cancer in her mother diagnosed in her 59s as well as maternal aunt. No genetic testing done previously. EXAM: DIGITAL DIAGNOSTIC bilateral MAMMOGRAM WITH CAD AND TOMO ULTRASOUND bilateral BREAST COMPARISON:  Previous exam(s). ACR Breast Density Category c: The breast tissue is heterogeneously dense, which may obscure small masses. FINDINGS: Examination demonstrates an oval circumscribed 4.4 cm mass over the upper outer right breast accounting for patient's palpable abnormality. Remainder of the right breast is unchanged. There is no definite focal abnormality over the upper central left periareolar region to correspond to patient's palpable abnormality. There are stable post lumpectomy changes over the upper-outer left breast. Mammographic images were processed with CAD. On physical exam, there  is an oval 3 x 4 cm firm mobile mass over the 9:30 position of the right breast 7 cm from the nipple. I palpate no focal abnormality over the upper central left periareolar region. Targeted ultrasound is performed, showing an oval predominately circumscribed isoechoic mass with parallel long axis at the 9:30 position of the right breast 7 cm from the nipple corresponding to patient's mammographic and palpable abnormality. This measures 1.9 x 3.3 x 4.3 cm. There is mild to moderate internal vascularity. Ultrasound the right axilla is unremarkable. Ultrasound over the upper central left periareolar region demonstrates an oval hypoechoic mass at the 1 o'clock position 1 cm from the nipple measuring 4 x  6 x 6 mm with possible intraductal extension. There is subtle border irregularity. Ultrasound of the left axilla is normal. IMPRESSION: 1. Indeterminate 4.3 cm mass over the 9:30 position of the right breast 7 cm from the nipple corresponding to patient's palpable abnormality. 2. Indeterminate 6 mm mass over the 1 o'clock position of the left breast 1 cm from the nipple. RECOMMENDATION: Recommend ultrasound-guided core needle biopsy of these bilateral indeterminate masses. I have discussed the findings and recommendations with the patient. Results were also provided in writing at the conclusion of the visit. If applicable, a reminder letter will be sent to the patient regarding the next appointment. BI-RADS CATEGORY  4: Suspicious. Biopsy will be scheduled here at the [facility] prior to patient's departure. Electronically Signed   By: Marin Olp M.D.   On: 11/21/2018 11:09   US Breast Ltd Uni Right Inc Axilla  Result Date: 11/21/2018 CLINICAL DATA:  Patient presents for a bilateral diagnostic examination due to a palpable abnormality for 1-2 months over the upper-outer right breast. Patient's healthcare provider states a palpable abnormality over the upper central left periareolar region. Patient has a history of a previous left malignant lumpectomy 2008. Positive family history breast cancer in her mother diagnosed in her 8s as well as maternal aunt. No genetic testing done previously. EXAM: DIGITAL DIAGNOSTIC bilateral MAMMOGRAM WITH CAD AND TOMO ULTRASOUND bilateral BREAST COMPARISON:  Previous exam(s). ACR Breast Density Category c: The breast tissue is heterogeneously dense, which may obscure small masses. FINDINGS: Examination demonstrates an oval circumscribed 4.4 cm mass over the upper outer right breast accounting for patient's palpable abnormality. Remainder of the right breast is unchanged. There is no definite focal abnormality over the upper central left periareolar region to correspond to  patient's palpable abnormality. There are stable post lumpectomy changes over the upper-outer left breast. Mammographic images were processed with CAD. On physical exam, there is an oval 3 x 4 cm firm mobile mass over the 9:30 position of the right breast 7 cm from the nipple. I palpate no focal abnormality over the upper central left periareolar region. Targeted ultrasound is performed, showing an oval predominately circumscribed isoechoic mass with parallel long axis at the 9:30 position of the right breast 7 cm from the nipple corresponding to patient's mammographic and palpable abnormality. This measures 1.9 x 3.3 x 4.3 cm. There is mild to moderate internal vascularity. Ultrasound the right axilla is unremarkable. Ultrasound over the upper central left periareolar region demonstrates an oval hypoechoic mass at the 1 o'clock position 1 cm from the nipple measuring 4 x 6 x 6 mm with possible intraductal extension. There is subtle border irregularity. Ultrasound of the left axilla is normal. IMPRESSION: 1. Indeterminate 4.3 cm mass over the 9:30 position of the right breast 7 cm from the nipple  corresponding to patient's palpable abnormality. 2. Indeterminate 6 mm mass over the 1 o'clock position of the left breast 1 cm from the nipple. RECOMMENDATION: Recommend ultrasound-guided core needle biopsy of these bilateral indeterminate masses. I have discussed the findings and recommendations with the patient. Results were also provided in writing at the conclusion of the visit. If applicable, a reminder letter will be sent to the patient regarding the next appointment. BI-RADS CATEGORY  4: Suspicious. Biopsy will be scheduled here at the [facility] prior to patient's departure. Electronically Signed   By: Marin Olp M.D.   On: 11/21/2018 11:09   Ms Digital Diag Tomo Bilat  Result Date: 11/21/2018 CLINICAL DATA:  Patient presents for a bilateral diagnostic examination due to a palpable abnormality for 1-2  months over the upper-outer right breast. Patient's healthcare provider states a palpable abnormality over the upper central left periareolar region. Patient has a history of a previous left malignant lumpectomy 2008. Positive family history breast cancer in her mother diagnosed in her 18s as well as maternal aunt. No genetic testing done previously. EXAM: DIGITAL DIAGNOSTIC bilateral MAMMOGRAM WITH CAD AND TOMO ULTRASOUND bilateral BREAST COMPARISON:  Previous exam(s). ACR Breast Density Category c: The breast tissue is heterogeneously dense, which may obscure small masses. FINDINGS: Examination demonstrates an oval circumscribed 4.4 cm mass over the upper outer right breast accounting for patient's palpable abnormality. Remainder of the right breast is unchanged. There is no definite focal abnormality over the upper central left periareolar region to correspond to patient's palpable abnormality. There are stable post lumpectomy changes over the upper-outer left breast. Mammographic images were processed with CAD. On physical exam, there is an oval 3 x 4 cm firm mobile mass over the 9:30 position of the right breast 7 cm from the nipple. I palpate no focal abnormality over the upper central left periareolar region. Targeted ultrasound is performed, showing an oval predominately circumscribed isoechoic mass with parallel long axis at the 9:30 position of the right breast 7 cm from the nipple corresponding to patient's mammographic and palpable abnormality. This measures 1.9 x 3.3 x 4.3 cm. There is mild to moderate internal vascularity. Ultrasound the right axilla is unremarkable. Ultrasound over the upper central left periareolar region demonstrates an oval hypoechoic mass at the 1 o'clock position 1 cm from the nipple measuring 4 x 6 x 6 mm with possible intraductal extension. There is subtle border irregularity. Ultrasound of the left axilla is normal. IMPRESSION: 1. Indeterminate 4.3 cm mass over the 9:30  position of the right breast 7 cm from the nipple corresponding to patient's palpable abnormality. 2. Indeterminate 6 mm mass over the 1 o'clock position of the left breast 1 cm from the nipple. RECOMMENDATION: Recommend ultrasound-guided core needle biopsy of these bilateral indeterminate masses. I have discussed the findings and recommendations with the patient. Results were also provided in writing at the conclusion of the visit. If applicable, a reminder letter will be sent to the patient regarding the next appointment. BI-RADS CATEGORY  4: Suspicious. Biopsy will be scheduled here at the [facility] prior to patient's departure. Electronically Signed   By: Marin Olp M.D.   On: 11/21/2018 11:09   Mm Clip Placement Left  Result Date: 12/01/2018 CLINICAL DATA:  Status post ultrasound-guided core biopsy of left breast mass EXAM: DIAGNOSTIC LEFT MAMMOGRAM POST ULTRASOUND BIOPSY COMPARISON:  Previous exam(s). FINDINGS: Mammographic images were obtained following left breast ultrasound guided biopsy of question intraductal mass at 1 o'clock. Cc and lateral  views of the left breast demonstrate ribbon biopsy clip in the area of concern. IMPRESSION: Post biopsy mammogram demonstrating biopsy clip in the area of concern. Final Assessment: Post Procedure Mammograms for Marker Placement Electronically Signed   By: Abelardo Diesel M.D.   On: 12/01/2018 09:13   Mm Clip Placement Right  Result Date: 12/01/2018 CLINICAL DATA:  Status post ultrasound-guided core biopsy of right breast mass EXAM: DIAGNOSTIC RIGHT MAMMOGRAM POST ULTRASOUND BIOPSY COMPARISON:  Previous exam(s). FINDINGS: Mammographic images were obtained following right breast ultrasound guided biopsy of mass at 9:30 o'clock. Cc and lateral views of the right breast demonstrate ribbon biopsy clip in the mass of concern. IMPRESSION: Post biopsy mammogram demonstrating ribbon biopsy clip in the mass of concern. Final Assessment: Post Procedure Mammograms  for Marker Placement Electronically Signed   By: Abelardo Diesel M.D.   On: 12/01/2018 09:14   [place] Lt Breast Bx W Loc Dev 1st Lesion Img Bx Spec US Guide  Addendum Date: 12/03/2018   ADDENDUM REPORT: 12/03/2018 09:03 ADDENDUM: Pathology revealed FIBROCYSTIC CHANGES TO INCLUDE STROMAL FIBROSIS of the LEFT breast, 1 o'clock. This was found to be discordant by Dr. Abelardo Diesel, with excision recommended. Pathology revealed GRADE I-II INVASIVE DUCTAL CARCINOMA of the RIGHT breast, 9:30 o'clock. This was found to be concordant by Dr. Abelardo Diesel. Pathology results were discussed with the patient by telephone. The patient reported doing well after the biopsies with tenderness at the sites. Post biopsy instructions and care were reviewed and questions were answered. The patient was encouraged to call [facility] for any additional concerns. The patient was referred to [facility] on December 10, 2018. Pathology results reported by Terie Purser, RN on 12/03/2018. Electronically Signed   By: Abelardo Diesel M.D.   On: 12/03/2018 09:03   Result Date: 12/03/2018 CLINICAL DATA:  Left breast mass for biopsy EXAM: ULTRASOUND GUIDED LEFT BREAST CORE NEEDLE BIOPSY COMPARISON:  Previous exam(s). FINDINGS: I met with the patient and we discussed the procedure of ultrasound-guided biopsy, including benefits and alternatives. We discussed the high likelihood of a successful procedure. We discussed the risks of the procedure, including infection, bleeding, tissue injury, clip migration, and inadequate sampling. Informed written consent was given. The usual time-out protocol was performed immediately prior to the procedure. Lesion quadrant: Upper outer quadrant Using sterile technique and 1% Lidocaine as local anesthetic, under direct ultrasound visualization, a 14 gauge spring-loaded device was used to perform biopsy of question  intraductal mass at the left breast 1 o'clock using a lateral approach. At the conclusion of the procedure a ribbon tissue marker clip was deployed into the biopsy cavity. Follow up 2 view mammogram was performed and dictated separately. IMPRESSION: Ultrasound guided biopsy of left breast.  No apparent complications. Electronically Signed: By: Abelardo Diesel M.D. On: 12/01/2018 09:12   [place] Rt Breast Bx W Loc Dev 1st Lesion Img Bx Spec US Guide  Result Date: 12/01/2018 CLINICAL DATA:  Right breast mass for biopsy EXAM: ULTRASOUND GUIDED RIGHT BREAST CORE NEEDLE BIOPSY COMPARISON:  Previous exam(s). FINDINGS: I met with the patient and we discussed the procedure of ultrasound-guided biopsy, including benefits and alternatives. We discussed the high likelihood of a successful procedure. We discussed the risks of the procedure, including infection, bleeding, tissue injury, clip migration, and inadequate sampling. Informed written consent was given. The usual time-out protocol was performed immediately prior to the procedure. Lesion quadrant: Upper outer  quadrant Using sterile technique and 1% Lidocaine as local anesthetic, under direct ultrasound visualization, a 12 gauge spring-loaded device was used to perform biopsy of homogeneous mass at the right breast 9:30 o'clock using a lateral approach. At the conclusion of the procedure a ribbon tissue marker clip was deployed into the biopsy cavity. Follow up 2 view mammogram was performed and dictated separately. IMPRESSION: Ultrasound guided biopsy of right breast. No apparent complications. Electronically Signed   By: Abelardo Diesel M.D.   On: 12/01/2018 09:11      IMPRESSION/PLAN: We discussed her surgical options.  She is also meeting with Dr. Donne Hazel today.  Ultimately, the patient is enthusiastic about undergoing bilateral mastectomies.  Regarding systemic therapy she will discuss Oncotype testing with Dr. Jana Hakim.  We discussed adjuvant radiotherapy which  very well not be needed after mastectomies.  We discussed indications for postmastectomy radiation.  I will see her back PRN for treatment planning, if warranted.  She knows that we will discuss her case adjuvantly at breast tumor board.  I wished her the very best   Genetic counseling referral will be made  __________________________________________   [name], MD

## 2018-12-10 NOTE — Progress Notes (Signed)
Nutrition Assessment  Reason for Assessment:  Pt seen in Breast Clinic  ASSESSMENT:   63 year old female with new diagnosis of breast cancer.  Past medical history of left breast cancer in 2009, HTN.  Patient reports normal appetite.  Medications:  reviewed  Labs: glucose 103  Anthropometrics:   Height: 66 inches Weight: 168 lb 3.2 oz BMI: 27   NUTRITION DIAGNOSIS: Food and nutrition related knowledge deficit related to new diagnosis of breast cancer as evidenced by no prior need for nutrition related information.  INTERVENTION:   Discussed and provided packet of information regarding nutritional tips for breast cancer patients.  Questions answered.  Teachback method used.  Contact information provided and patient knows to contact me with questions/concerns.    MONITORING, EVALUATION, and GOAL: Pt will consume a healthy plant based diet to maintain lean body mass throughout treatment.   Zeah Germano B. Zenia Resides, [place], [place] Registered Dietitian [phone] (pager)

## 2018-12-10 NOTE — Patient Instructions (Signed)

## 2018-12-10 NOTE — Therapy (Addendum)
[place], [place], [address] Phone: [phone]   Fax:  [phone]  Physical Therapy Evaluation  Patient Details  Name: Levita Monical MRN: [id number] Date of Birth: 01/26/1956 Referring Provider (PT): Dr. Rolm Bookbinder   Encounter Date: 12/10/2018  PT End of Session - 12/10/18 1111    Visit Number  1    Number of Visits  2    Date for PT Re-Evaluation  02/04/19    PT Start Time  0957    PT Stop Time  8811   Also saw pt from 1034-1047 for a total of 29 minutes   PT Time Calculation (min)  16 min    Activity Tolerance  Patient tolerated treatment well    Behavior During Therapy  [facility] for tasks assessed/performed       Past Medical History:  Diagnosis Date  . Cancer ([place]) 08/29/2007   Left DICS, stage 2  . Hypertension     Past Surgical History:  Procedure Laterality Date  . BREAST LUMPECTOMY Left 09/09/2007  . CYST REMOVAL NECK      There were no vitals filed for this visit.   Subjective Assessment - 12/10/18 1104    Subjective  Patient reports she is here today to be seen by her medical team for her newly diagnosed right breast cancer.    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 It measures 4.3 cm and is located in the upper outer quadrant. It is ER/PR positive, HER2 negative with a Ki67 of 10%. There is a place on the left breast that is likely scar tissue from previous breast surgery that measures 6 mm and is located in the upper outer quadrant. She has a previous left breast cancer history in 2008 and underwent a left lumpectomy, sentinel node biopsy, radiation, and 5 years of Tamoxifen.    Patient Stated Goals  Reduce lymphedema risk and learn post op shoulder ROM HEP    Currently in Pain?  No/denies         [facility] PT Assessment - 12/10/18 0001       Assessment   Medical Diagnosis  Right breast cancer    Referring Provider (PT)  Dr. Rolm Bookbinder    Onset Date/Surgical Date  11/21/18    Hand Dominance  Right    Prior Therapy  none      Precautions   Precautions  Other (comment)    Precaution Comments  active cancer; left arm lymphedema risk      Restrictions   Weight Bearing Restrictions  No      Balance Screen   Has the patient fallen in the past 6 months  No    Has the patient had a decrease in activity level because of a fear of falling?   No    Is the patient reluctant to leave their home because of a fear of falling?   No      Home Environment   Living Environment  Private residence    Living Arrangements  Parent   64 y.o. mother   Available Help at Discharge  Family      Prior Function   Level of [place]  Retired    Leisure  She walks her dogs several times per day and walks by herself for 30 min about 1x/week  Cognition   Overall Cognitive Status  Within Functional Limits for tasks assessed      Posture/Postural Control   Posture/Postural Control  Postural limitations    Postural Limitations  Rounded Shoulders;Forward head      ROM / Strength   AROM / PROM / Strength  AROM;Strength      AROM   AROM Assessment Site  Shoulder;Cervical    Right/Left Shoulder  Right;Left    Right Shoulder Extension  65 Degrees    Right Shoulder Flexion  150 Degrees    Right Shoulder ABduction  163 Degrees    Right Shoulder Internal Rotation  72 Degrees    Right Shoulder External Rotation  87 Degrees    Left Shoulder Extension  65 Degrees    Left Shoulder Flexion  147 Degrees    Left Shoulder ABduction  159 Degrees    Left Shoulder Internal Rotation  75 Degrees    Left Shoulder External Rotation  76 Degrees    Cervical Flexion  WNL    Cervical Extension  WNL    Cervical - Right Side Bend  WNL    Cervical - Left Side Bend  WNL    Cervical - Right Rotation  WNL    Cervical - Left  Rotation  WNL      Strength   Overall Strength  Within functional limits for tasks performed        LYMPHEDEMA/ONCOLOGY QUESTIONNAIRE - 12/10/18 1110      Type   Cancer Type  Right breast cancer      Lymphedema Assessments   Lymphedema Assessments  Upper extremities      Right Upper Extremity Lymphedema   10 cm Proximal to Olecranon Process  29 cm    Olecranon Process  24.6 cm    10 cm Proximal to Ulnar Styloid Process  21.1 cm    Just Proximal to Ulnar Styloid Process  16.2 cm    Across Hand at [employer]  18.8 cm    At [place] of 2nd Digit  6.6 cm      Left Upper Extremity Lymphedema   10 cm Proximal to Olecranon Process  29.1 cm    Olecranon Process  25 cm    10 cm Proximal to Ulnar Styloid Process  20.7 cm    Just Proximal to Ulnar Styloid Process  16.6 cm    Across Hand at [employer]  18.5 cm    At [place] of 2nd Digit  6.4 cm          Quick Dash - 12/10/18 0001    Open a tight or new jar  No difficulty    Do heavy household chores (wash walls, wash floors)  No difficulty    Carry a shopping bag or briefcase  No difficulty    Wash your back  No difficulty    Use a knife to cut food  No difficulty    Recreational activities in which you take some force or impact through your arm, shoulder, or hand (golf, hammering, tennis)  No difficulty    During the past week, to what extent has your arm, shoulder or hand problem interfered with your normal social activities with family, friends, neighbors, or groups?  Not at all    During the past week, to what extent has your arm, shoulder or hand problem limited your work or other regular daily activities  Not at all    Arm, shoulder, or hand pain.  None  Tingling (pins and needles) in your arm, shoulder, or hand  None    Difficulty Sleeping  No difficulty    DASH Score  0 %        Objective measurements completed on examination: See above findings.        Patient was instructed today in a home exercise  program today for post op shoulder range of motion. These included active assist shoulder flexion in sitting, scapular retraction, wall walking with shoulder abduction, and hands behind head external rotation.  She was encouraged to do these twice a day, holding 3 seconds and repeating 5 times when permitted by her physician.          PT Education - 12/10/18 1110    Education Details  Lymphedema risk reduction and post op shoulder ROM HEP    Person(s) Educated  Patient;Other (comment)   sister   Methods  Explanation;Demonstration;Handout    Comprehension  Returned demonstration;Verbalized understanding          PT Long Term Goals - 12/10/18 1115      PT LONG TERM GOAL #1   Title  Patient will demonstrate she has regained full ROM and function post operatively compared to baseline measurements.    Time  8    Period  Weeks    Status  New      Breast Clinic Goals - 12/10/18 1115      Patient will be able to verbalize understanding of pertinent lymphedema risk reduction practices relevant to her diagnosis specifically related to skin care.   Time  1    Period  Days    Status  Achieved      Patient will be able to return demonstrate and/or verbalize understanding of the post-op home exercise program related to regaining shoulder range of motion.   Time  1    Period  Days    Status  Achieved      Patient will be able to verbalize understanding of the importance of attending the postoperative After Breast Cancer Class for further lymphedema risk reduction education and therapeutic exercise.   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 It measures 4.3 cm and is located in the upper outer quadrant. It is ER/PR positive, HER2 negative with a Ki67 of 10%. There is a place on the left breast that is likely scar  tissue from previous breast surgery that measures 6 mm and is located in the upper outer quadrant. She has a previous left breast cancer history in 2008 and underwent a left lumpectomy, sentinel node biopsy, radiation, and 5 years of Tamoxifen. Her multidisciplinary medical team met prior to her assessments to determine a recommended treatment plan. She is planning to have a bilateral mastectomy and right sentinel node biopsy followed by anti-estrogen therapy. She will benefit from a post op PT visit to reassess and determine needs.    History and Personal Factors relevant to plan of care:  Previous left breast cancer; caregivre for her elderly mother    Clinical Presentation  Stable    Clinical Presentation due to:  Stable condition    Clinical Decision Making  Low    Rehab Potential  Excellent    Clinical Impairments Affecting Rehab Potential  None  PT Frequency  --   Eval and 1 f/u visit   PT Treatment/Interventions  ADLs/Self Care Home Management;Patient/family education;Therapeutic exercise    PT Next Visit Plan  Will reassess 3-4 weeks post op to determine needs    PT Home Exercise Plan  Post op shoulder ROM HEP    Consulted and Agree with Plan of Care  Patient;Family member/caregiver    Family Member Consulted  sister       Patient will benefit from skilled therapeutic intervention in order to improve the following deficits and impairments:  Decreased range of motion, Impaired UE functional use, Pain, Decreased knowledge of precautions, Postural dysfunction  Visit Diagnosis: Malignant neoplasm of upper-outer quadrant of right breast in female, estrogen receptor positive ([place]) - Plan: PT plan of care cert/re-cert  Abnormal posture - Plan: PT plan of care cert/re-cert   Patient will follow up at outpatient cancer rehab 3-4 weeks following surgery.  If the patient requires physical therapy at that time, a specific plan will be dictated and sent to the referring physician for  approval. The patient was educated today on appropriate basic range of motion exercises to begin post operatively and the importance of attending the After Breast Cancer class following surgery.  Patient was educated today on lymphedema risk reduction practices as it pertains to recommendations that will benefit the patient immediately following surgery.  She verbalized good understanding.     Problem List Patient Active Problem List   Diagnosis Date Noted  . Malignant neoplasm of upper-outer quadrant of right breast in female, estrogen receptor positive ([place]) 12/08/2018   Annia Friendly, PT 12/10/18 11:19 AM  [place], [place], [address] Phone: [phone]   Fax:  [phone]  Name: Jericho Cieslik MRN: [id number] Date of Birth: 1955/11/19   PHYSICAL THERAPY DISCHARGE SUMMARY  Visits from Start of Care: 1  Current functional level related to goals / functional outcomes: See above   Remaining deficits: See above   Education / Equipment: HEP  Plan: Patient agrees to discharge.  Patient goals were not met. Patient is being discharged due to not returning since the last visit.  ?????    Pt unable to return during the time frame due to Covid. Pt is now being seen in PT.  Allyson Sabal [place], [place] 04/30/19 9:35 AM

## 2018-12-11 ENCOUNTER — Telehealth: Payer: Self-pay | Admitting: Oncology

## 2018-12-11 NOTE — Telephone Encounter (Signed)
Called regarding 3/30

## 2018-12-16 ENCOUNTER — Telehealth: Payer: Self-pay | Admitting: *Deleted

## 2018-12-16 NOTE — Telephone Encounter (Signed)
  Oncology Nurse Navigator Documentation  Navigator Location: [facility] (12/16/18 0800)   )Navigator Encounter Type: Telephone;[facility] Follow-up (12/16/18 0800) Telephone: Outgoing Call;Clinic/[facility]C Follow-up (12/16/18 0800)     Surgery Date: 01/08/19 (12/16/18 0800) Genetic Counseling Date: 12/31/18 (12/16/18 0800) Genetic Counseling Type: Non-Urgent (12/16/18 0800)       Treatment Initiated Date: 01/08/19 (12/16/18 0800)                                Time Spent with Patient: 15 (12/16/18 0800)

## 2018-12-26 NOTE — Progress Notes (Addendum)
PCP - Dr. Ernie Hew Cardiologist - pt denies  Chest x-ray - pt denies EKG - 12/29/2018 at PAT appt  Stress Test - pt denies ECHO -  Pt denies  Cardiac Cath - pt denies  Sleep Study - pt denies CPAP - n/a  Fasting Blood Sugar - n/a Checks Blood Sugar _____ times a day-n/a  Blood Thinner Instructions: n/a Aspirin Instructions: n/a  Anesthesia review: NO  Patient denies shortness of breath, fever, cough and chest pain at PAT appointment  Patient verbalized understanding of instructions that were given to them at the PAT appointment. Patient was also instructed that they will need to review over the PAT instructions again at home before surgery.

## 2018-12-26 NOTE — Pre-Procedure Instructions (Addendum)
Gloria Mitchell  12/26/2018      [facility] [address] [place], [place]. [place]. [place] [address] Phone: [phone] Fax: [phone]    Your procedure is scheduled on January 08, 2019.  Report to [facility] Entrance "A" at 800 AM.  Call this number if you have problems the morning of surgery:  [phone]   Remember:  Do not eat after midnight.  You may drink clear liquids until 700 AM.  Clear liquids allowed are:      Ensure Pre-surgery drink,  Water, Juice (non-citric and without pulp), Clear Tea, Black Coffee only and Gatorade   Please complete your PRE-SURGERY ENSURE that was provided to you by 700 AM.  Please, if able, drink it in one setting. DO NOT SIP.   Take these medicines the morning of surgery with A SIP OF WATER  Omeprazole -Sodium Bicarbonate (Zegerid) Benadryl-if needed  7 days prior to surgery STOP taking any Aspirin (unless otherwise instructed by your surgeon), Aleve, Naproxen, Ibuprofen, Motrin, Advil, Goody's, BC's, all herbal medications, fish oil, and all vitamins    Do not wear jewelry, make-up or nail polish.  Do not wear lotions, powders, or perfumes, or deodorant.  Do not shave 48 hours prior to surgery.  Do not bring valuables to the hospital.  [facility] is not responsible for any belongings or valuables.  Contacts, dentures or bridgework may not be worn into surgery.  Leave your suitcase in the car.  After surgery it may be brought to your room.  For patients admitted to the hospital, discharge time will be determined by your treatment team.  Patients discharged the day of surgery will not be allowed to drive home.    [place]- Preparing For Surgery  Before surgery, you can play an important role. Because skin is not sterile, your skin needs to be as free of germs as possible. You can reduce the number of germs on your skin by washing with CHG (chlorahexidine gluconate) Soap before  surgery.  CHG is an antiseptic cleaner which kills germs and bonds with the skin to continue killing germs even after washing.    Oral Hygiene is also important to reduce your risk of infection.  Remember - BRUSH YOUR TEETH THE MORNING OF SURGERY WITH YOUR REGULAR TOOTHPASTE  Please do not use if you have an allergy to CHG or antibacterial soaps. If your skin becomes reddened/irritated stop using the CHG.  Do not shave (including legs and underarms) for at least 48 hours prior to first CHG shower. It is OK to shave your face.  Please follow these instructions carefully.   1. Shower the NIGHT BEFORE SURGERY and the MORNING OF SURGERY with CHG.   2. If you chose to wash your hair, wash your hair first as usual with your normal shampoo.  3. After you shampoo, rinse your hair and body thoroughly to remove the shampoo.  4. Use CHG as you would any other liquid soap. You can apply CHG directly to the skin and wash gently with a scrungie or a clean washcloth.   5. Apply the CHG Soap to your body ONLY FROM THE NECK DOWN.  Do not use on open wounds or open sores. Avoid contact with your eyes, ears, mouth and genitals (private parts). Wash Face and genitals (private parts)  with your normal soap.  6. Wash thoroughly, paying special attention to the area where your surgery will be  performed.  7. Thoroughly rinse your body with warm water from the neck down.  8. DO NOT shower/wash with your normal soap after using and rinsing off the CHG Soap.  9. Pat yourself dry with a CLEAN TOWEL.  10. Wear CLEAN PAJAMAS to bed the night before surgery, wear comfortable clothes the morning of surgery  11. Place CLEAN SHEETS on your bed the night of your first shower and DO NOT SLEEP WITH PETS.  Day of Surgery:  Do not apply any deodorants/lotions.  Please wear clean clothes to the hospital/surgery center.   Remember to brush your teeth WITH YOUR REGULAR TOOTHPASTE.  Please read over the following fact  sheets that you were given.

## 2018-12-29 ENCOUNTER — Encounter (HOSPITAL_COMMUNITY)
Admission: RE | Admit: 2018-12-29 | Discharge: 2018-12-29 | Disposition: A | Discharge status: Home / Self Care | Payer: Medicaid Other | Source: Ambulatory Visit | Attending: General Surgery | Admitting: General Surgery

## 2018-12-29 ENCOUNTER — Encounter (HOSPITAL_COMMUNITY): Payer: Self-pay

## 2018-12-29 ENCOUNTER — Other Ambulatory Visit: Payer: Self-pay

## 2018-12-29 DIAGNOSIS — Z01818 Encounter for other preprocedural examination: Secondary | ICD-10-CM | POA: Insufficient documentation

## 2018-12-29 LAB — BASIC METABOLIC PANEL
Anion gap: 7 (ref 5–15)
BUN: 13 mg/dL (ref 8–23)
CALCIUM: 9.5 mg/dL (ref 8.9–10.3)
CO2: 27 mmol/L (ref 22–32)
Chloride: 105 mmol/L (ref 98–111)
Creatinine, Ser: 0.88 mg/dL (ref 0.44–1.00)
GFR calc Af Amer: >60 mL/min (ref >60)
GFR calc non Af Amer: >60 mL/min (ref >60)
Glucose, Bld: 111 mg/dL — ABNORMAL HIGH (ref 70–99)
Potassium: 4 mmol/L (ref 3.5–5.1)
Sodium: 139 mmol/L (ref 135–145)

## 2018-12-29 LAB — CBC
HCT: 40.8 % (ref 36.0–46.0)
Hemoglobin: 12.8 g/dL (ref 12.0–15.0)
MCH: 29.2 pg (ref 26.0–34.0)
MCHC: 31.4 g/dL (ref 30.0–36.0)
MCV: 92.9 fL (ref 80.0–100.0)
Platelets: 284 10*3/uL (ref 150–400)
RBC: 4.39 MIL/uL (ref 3.87–5.11)
RDW: 12.9 % (ref 11.5–15.5)
WBC: 6.4 10*3/uL (ref 4.0–10.5)
nRBC: 0 % (ref 0.0–0.2)

## 2018-12-31 ENCOUNTER — Encounter (HOSPITAL_COMMUNITY): Payer: Self-pay | Admitting: *Deleted

## 2018-12-31 ENCOUNTER — Inpatient Hospital Stay: Payer: Medicaid Other

## 2018-12-31 ENCOUNTER — Inpatient Hospital Stay: Payer: Medicaid Other | Admitting: Genetic Counselor

## 2019-01-08 ENCOUNTER — Ambulatory Visit (HOSPITAL_COMMUNITY)
Admission: RE | Admit: 2019-01-08 | Discharge: 2019-01-08 | Disposition: A | Discharge status: Home / Self Care | Payer: Medicaid Other | Source: Ambulatory Visit | Attending: General Surgery | Admitting: General Surgery

## 2019-01-08 ENCOUNTER — Encounter (HOSPITAL_COMMUNITY): Payer: Self-pay

## 2019-01-08 ENCOUNTER — Ambulatory Visit (HOSPITAL_COMMUNITY): Payer: Medicaid Other | Admitting: Certified Registered Nurse Anesthetist

## 2019-01-08 ENCOUNTER — Other Ambulatory Visit: Payer: Self-pay

## 2019-01-08 ENCOUNTER — Observation Stay (HOSPITAL_COMMUNITY)
Admission: RE | Admit: 2019-01-08 | Discharge: 2019-01-09 | Disposition: A | Discharge status: Home / Self Care | Payer: Medicaid Other | Attending: General Surgery | Admitting: General Surgery

## 2019-01-08 ENCOUNTER — Encounter (HOSPITAL_COMMUNITY)
Admission: RE | Disposition: A | Discharge status: Home / Self Care | Payer: Self-pay | Source: Home / Self Care | Attending: General Surgery

## 2019-01-08 DIAGNOSIS — I10 Essential (primary) hypertension: Secondary | ICD-10-CM | POA: Diagnosis not present

## 2019-01-08 DIAGNOSIS — Z882 Allergy status to sulfonamides status: Secondary | ICD-10-CM | POA: Diagnosis not present

## 2019-01-08 DIAGNOSIS — C50911 Malignant neoplasm of unspecified site of right female breast: Secondary | ICD-10-CM | POA: Diagnosis present

## 2019-01-08 DIAGNOSIS — J45909 Unspecified asthma, uncomplicated: Secondary | ICD-10-CM | POA: Insufficient documentation

## 2019-01-08 DIAGNOSIS — Z853 Personal history of malignant neoplasm of breast: Secondary | ICD-10-CM | POA: Diagnosis not present

## 2019-01-08 DIAGNOSIS — Z8249 Family history of ischemic heart disease and other diseases of the circulatory system: Secondary | ICD-10-CM | POA: Insufficient documentation

## 2019-01-08 DIAGNOSIS — C50411 Malignant neoplasm of upper-outer quadrant of right female breast: Principal | ICD-10-CM | POA: Insufficient documentation

## 2019-01-08 DIAGNOSIS — K219 Gastro-esophageal reflux disease without esophagitis: Secondary | ICD-10-CM | POA: Diagnosis not present

## 2019-01-08 DIAGNOSIS — Z87892 Personal history of anaphylaxis: Secondary | ICD-10-CM | POA: Diagnosis not present

## 2019-01-08 DIAGNOSIS — Z170 Estrogen receptor positive status [ER+]: Principal | ICD-10-CM

## 2019-01-08 DIAGNOSIS — N6092 Unspecified benign mammary dysplasia of left breast: Secondary | ICD-10-CM | POA: Diagnosis not present

## 2019-01-08 DIAGNOSIS — Z79899 Other long term (current) drug therapy: Secondary | ICD-10-CM | POA: Insufficient documentation

## 2019-01-08 DIAGNOSIS — N6012 Diffuse cystic mastopathy of left breast: Secondary | ICD-10-CM | POA: Insufficient documentation

## 2019-01-08 HISTORY — PX: MASTECTOMY W/ SENTINEL NODE BIOPSY: SHX2001

## 2019-01-08 SURGERY — MASTECTOMY WITH SENTINEL LYMPH NODE BIOPSY
Anesthesia: General | Site: Breast | Laterality: Bilateral

## 2019-01-08 MED ORDER — MIDAZOLAM HCL 2 MG/2ML IJ SOLN
INTRAMUSCULAR | Status: AC
  Administered 2019-01-08: 2 mg via INTRAVENOUS
  Filled 2019-01-08: qty 2

## 2019-01-08 MED ORDER — ONDANSETRON 4 MG PO TBDP: 4.0000 mg | ORAL_TABLET | Freq: Four times a day (QID) | ORAL | Status: DC | PRN

## 2019-01-08 MED ORDER — DEXAMETHASONE SODIUM PHOSPHATE 10 MG/ML IJ SOLN
INTRAMUSCULAR | Status: AC
  Filled 2019-01-08: qty 1

## 2019-01-08 MED ORDER — MORPHINE SULFATE (PF) 2 MG/ML IV SOLN: 1.0000 mg | INTRAVENOUS | Status: DC | PRN

## 2019-01-08 MED ORDER — MEPERIDINE HCL 50 MG/ML IJ SOLN: 6.2500 mg | INTRAMUSCULAR | Status: DC | PRN

## 2019-01-08 MED ORDER — PROPOFOL 10 MG/ML IV BOLUS
INTRAVENOUS | Status: DC | PRN
  Administered 2019-01-08: 160 mg via INTRAVENOUS

## 2019-01-08 MED ORDER — GABAPENTIN 100 MG PO CAPS
ORAL_CAPSULE | ORAL | Status: AC
  Administered 2019-01-08: 100 mg via ORAL
  Filled 2019-01-08: qty 1

## 2019-01-08 MED ORDER — KETOROLAC TROMETHAMINE 15 MG/ML IJ SOLN
INTRAMUSCULAR | Status: AC
  Administered 2019-01-08: 15 mg via INTRAVENOUS
  Filled 2019-01-08: qty 1

## 2019-01-08 MED ORDER — METHOCARBAMOL 500 MG PO TABS
500.0000 mg | ORAL_TABLET | Freq: Three times a day (TID) | ORAL | Status: DC
  Administered 2019-01-08 – 2019-01-09 (×3): 500 mg via ORAL
  Filled 2019-01-08 (×3): qty 1

## 2019-01-08 MED ORDER — CEFAZOLIN SODIUM-DEXTROSE 2-4 GM/100ML-% IV SOLN
2.0000 g | Freq: Three times a day (TID) | INTRAVENOUS | Status: AC
  Administered 2019-01-08 – 2019-01-09 (×2): 2 g via INTRAVENOUS
  Filled 2019-01-08 (×2): qty 100

## 2019-01-08 MED ORDER — FENTANYL CITRATE (PF) 100 MCG/2ML IJ SOLN
100.0000 ug | Freq: Once | INTRAMUSCULAR | Status: AC
  Administered 2019-01-08: 100 ug via INTRAVENOUS

## 2019-01-08 MED ORDER — ONDANSETRON HCL 4 MG/2ML IJ SOLN: 4.0000 mg | Freq: Four times a day (QID) | INTRAMUSCULAR | Status: DC | PRN

## 2019-01-08 MED ORDER — ONDANSETRON HCL 4 MG/2ML IJ SOLN
INTRAMUSCULAR | Status: AC
  Filled 2019-01-08: qty 2

## 2019-01-08 MED ORDER — OXYCODONE HCL 5 MG PO TABS
5.0000 mg | ORAL_TABLET | ORAL | Status: DC | PRN
  Filled 2019-01-08: qty 1

## 2019-01-08 MED ORDER — SODIUM CHLORIDE 0.9 % IV SOLN
INTRAVENOUS | Status: DC | PRN
  Administered 2019-01-08: 25 ug/min via INTRAVENOUS

## 2019-01-08 MED ORDER — ROCURONIUM BROMIDE 50 MG/5ML IV SOSY
PREFILLED_SYRINGE | INTRAVENOUS | Status: AC
  Filled 2019-01-08: qty 5

## 2019-01-08 MED ORDER — LACTATED RINGERS IV SOLN
INTRAVENOUS | Status: DC
  Administered 2019-01-08 (×2): via INTRAVENOUS

## 2019-01-08 MED ORDER — LIDOCAINE 2% (20 MG/ML) 5 ML SYRINGE
INTRAMUSCULAR | Status: AC
  Filled 2019-01-08: qty 5

## 2019-01-08 MED ORDER — METHYLENE BLUE 0.5 % INJ SOLN
INTRAVENOUS | Status: AC
  Filled 2019-01-08: qty 10

## 2019-01-08 MED ORDER — PANTOPRAZOLE SODIUM 40 MG PO TBEC
40.0000 mg | DELAYED_RELEASE_TABLET | Freq: Every day | ORAL | Status: DC
  Administered 2019-01-09: 40 mg via ORAL
  Filled 2019-01-08 (×2): qty 1

## 2019-01-08 MED ORDER — LIDOCAINE 2% (20 MG/ML) 5 ML SYRINGE
INTRAMUSCULAR | Status: DC | PRN
  Administered 2019-01-08: 80 mg via INTRAVENOUS

## 2019-01-08 MED ORDER — HYDROCHLOROTHIAZIDE 12.5 MG PO CAPS
12.5000 mg | ORAL_CAPSULE | ORAL | Status: DC
  Filled 2019-01-08: qty 1

## 2019-01-08 MED ORDER — BUPIVACAINE-EPINEPHRINE (PF) 0.25% -1:200000 IJ SOLN
INTRAMUSCULAR | Status: DC | PRN
  Administered 2019-01-08: 20 mL
  Administered 2019-01-08: 20 mL via PERINEURAL

## 2019-01-08 MED ORDER — PHENYLEPHRINE 40 MCG/ML (10ML) SYRINGE FOR IV PUSH (FOR BLOOD PRESSURE SUPPORT)
PREFILLED_SYRINGE | INTRAVENOUS | Status: DC | PRN
  Administered 2019-01-08: 80 ug via INTRAVENOUS
  Administered 2019-01-08 (×2): 120 ug via INTRAVENOUS

## 2019-01-08 MED ORDER — ACETAMINOPHEN 500 MG PO TABS
1000.0000 mg | ORAL_TABLET | Freq: Four times a day (QID) | ORAL | Status: DC
  Administered 2019-01-08 – 2019-01-09 (×3): 1000 mg via ORAL
  Filled 2019-01-08 (×3): qty 2

## 2019-01-08 MED ORDER — FENTANYL CITRATE (PF) 100 MCG/2ML IJ SOLN
INTRAMUSCULAR | Status: AC
  Administered 2019-01-08: 100 ug via INTRAVENOUS
  Filled 2019-01-08: qty 2

## 2019-01-08 MED ORDER — DEXMEDETOMIDINE HCL IN NACL 200 MCG/50ML IV SOLN
INTRAVENOUS | Status: AC
  Filled 2019-01-08: qty 50

## 2019-01-08 MED ORDER — KETOROLAC TROMETHAMINE 15 MG/ML IJ SOLN
15.0000 mg | INTRAMUSCULAR | Status: AC
  Administered 2019-01-08: 15 mg via INTRAVENOUS

## 2019-01-08 MED ORDER — DEXAMETHASONE SODIUM PHOSPHATE 10 MG/ML IJ SOLN
INTRAMUSCULAR | Status: DC | PRN
  Administered 2019-01-08: 10 mg via INTRAVENOUS

## 2019-01-08 MED ORDER — GABAPENTIN 100 MG PO CAPS
100.0000 mg | ORAL_CAPSULE | ORAL | Status: AC
  Administered 2019-01-08: 100 mg via ORAL

## 2019-01-08 MED ORDER — TECHNETIUM TC 99M SULFUR COLLOID FILTERED
1.0000 | Freq: Once | INTRAVENOUS | Status: AC | PRN
  Administered 2019-01-08: 1 via INTRADERMAL

## 2019-01-08 MED ORDER — ACETAMINOPHEN 500 MG PO TABS
ORAL_TABLET | ORAL | Status: AC
  Administered 2019-01-08: 1000 mg via ORAL
  Filled 2019-01-08: qty 2

## 2019-01-08 MED ORDER — LISINOPRIL 20 MG PO TABS
20.0000 mg | ORAL_TABLET | Freq: Every day | ORAL | Status: DC
  Filled 2019-01-08 (×2): qty 1

## 2019-01-08 MED ORDER — CEFAZOLIN SODIUM-DEXTROSE 2-4 GM/100ML-% IV SOLN
2.0000 g | INTRAVENOUS | Status: AC
  Administered 2019-01-08: 2 g via INTRAVENOUS

## 2019-01-08 MED ORDER — ONDANSETRON HCL 4 MG/2ML IJ SOLN: 4.0000 mg | Freq: Once | INTRAMUSCULAR | Status: DC | PRN

## 2019-01-08 MED ORDER — 0.9 % SODIUM CHLORIDE (POUR BTL) OPTIME
TOPICAL | Status: DC | PRN
  Administered 2019-01-08: 1000 mL

## 2019-01-08 MED ORDER — FENTANYL CITRATE (PF) 100 MCG/2ML IJ SOLN: 25.0000 ug | INTRAMUSCULAR | Status: DC | PRN

## 2019-01-08 MED ORDER — SIMETHICONE 80 MG PO CHEW: 40.0000 mg | CHEWABLE_TABLET | Freq: Four times a day (QID) | ORAL | Status: DC | PRN

## 2019-01-08 MED ORDER — ENSURE PRE-SURGERY PO LIQD
296.0000 mL | Freq: Once | ORAL | Status: DC
  Filled 2019-01-08: qty 296

## 2019-01-08 MED ORDER — MIDAZOLAM HCL 2 MG/2ML IJ SOLN
2.0000 mg | Freq: Once | INTRAMUSCULAR | Status: AC
  Administered 2019-01-08: 2 mg via INTRAVENOUS

## 2019-01-08 MED ORDER — SUGAMMADEX SODIUM 200 MG/2ML IV SOLN
INTRAVENOUS | Status: DC | PRN
  Administered 2019-01-08: 150 mg via INTRAVENOUS

## 2019-01-08 MED ORDER — DEXMEDETOMIDINE HCL IN NACL 200 MCG/50ML IV SOLN
INTRAVENOUS | Status: DC | PRN
  Administered 2019-01-08: 8 ug via INTRAVENOUS

## 2019-01-08 MED ORDER — FENTANYL CITRATE (PF) 250 MCG/5ML IJ SOLN
INTRAMUSCULAR | Status: AC
  Filled 2019-01-08: qty 5

## 2019-01-08 MED ORDER — ROCURONIUM BROMIDE 50 MG/5ML IV SOSY
PREFILLED_SYRINGE | INTRAVENOUS | Status: AC
  Filled 2019-01-08: qty 15

## 2019-01-08 MED ORDER — SODIUM CHLORIDE 0.9 % IV SOLN
INTRAVENOUS | Status: DC
  Administered 2019-01-08: 17:00:00 via INTRAVENOUS

## 2019-01-08 MED ORDER — FENTANYL CITRATE (PF) 100 MCG/2ML IJ SOLN
INTRAMUSCULAR | Status: DC | PRN
  Administered 2019-01-08: 50 ug via INTRAVENOUS
  Administered 2019-01-08: 100 ug via INTRAVENOUS

## 2019-01-08 MED ORDER — ONDANSETRON HCL 4 MG/2ML IJ SOLN
INTRAMUSCULAR | Status: DC | PRN
  Administered 2019-01-08: 4 mg via INTRAVENOUS

## 2019-01-08 MED ORDER — SODIUM CHLORIDE (PF) 0.9 % IJ SOLN
INTRAMUSCULAR | Status: AC
  Filled 2019-01-08: qty 10

## 2019-01-08 MED ORDER — BUPIVACAINE LIPOSOME 1.3 % IJ SUSP
INTRAMUSCULAR | Status: DC | PRN
  Administered 2019-01-08 (×2): 10 mL via PERINEURAL

## 2019-01-08 MED ORDER — ROCURONIUM BROMIDE 50 MG/5ML IV SOSY
PREFILLED_SYRINGE | INTRAVENOUS | Status: DC | PRN
  Administered 2019-01-08: 50 mg via INTRAVENOUS

## 2019-01-08 MED ORDER — PHENYLEPHRINE 40 MCG/ML (10ML) SYRINGE FOR IV PUSH (FOR BLOOD PRESSURE SUPPORT)
PREFILLED_SYRINGE | INTRAVENOUS | Status: AC
  Filled 2019-01-08: qty 30

## 2019-01-08 MED ORDER — ACETAMINOPHEN 500 MG PO TABS
1000.0000 mg | ORAL_TABLET | ORAL | Status: AC
  Administered 2019-01-08: 1000 mg via ORAL

## 2019-01-08 SURGICAL SUPPLY — 57 items
APPLIER CLIP 9.375 MED OPEN (MISCELLANEOUS) ×3
BINDER BREAST LRG (GAUZE/BANDAGES/DRESSINGS) IMPLANT
BINDER BREAST XLRG (GAUZE/BANDAGES/DRESSINGS) ×3 IMPLANT
BIOPATCH RED 1 DISK 7.0 (GAUZE/BANDAGES/DRESSINGS) ×4 IMPLANT
BIOPATCH RED 1IN DISK 7.0MM (GAUZE/BANDAGES/DRESSINGS) ×2
CANISTER SUCT 3000ML PPV (MISCELLANEOUS) ×3 IMPLANT
CHLORAPREP W/TINT 26ML (MISCELLANEOUS) ×3 IMPLANT
CLIP APPLIE 9.375 MED OPEN (MISCELLANEOUS) ×1 IMPLANT
CLOSURE WOUND 1/2 X4 (GAUZE/BANDAGES/DRESSINGS) ×1
CONT SPEC 4OZ CLIKSEAL STRL BL (MISCELLANEOUS) ×3 IMPLANT
COVER PROBE W GEL 5X96 (DRAPES) ×3 IMPLANT
COVER SURGICAL LIGHT HANDLE (MISCELLANEOUS) ×3 IMPLANT
COVER WAND RF STERILE (DRAPES) ×3 IMPLANT
DERMABOND ADVANCED (GAUZE/BANDAGES/DRESSINGS) ×2
DERMABOND ADVANCED .7 DNX12 (GAUZE/BANDAGES/DRESSINGS) ×1 IMPLANT
DRAIN CHANNEL 19F RND (DRAIN) ×3 IMPLANT
DRAPE LAPAROSCOPIC ABDOMINAL (DRAPES) ×3 IMPLANT
DRSG TEGADERM 4X4.75 (GAUZE/BANDAGES/DRESSINGS) ×6 IMPLANT
ELECT BLADE 4.0 EZ CLEAN MEGAD (MISCELLANEOUS) ×3
ELECT CAUTERY BLADE 6.4 (BLADE) ×3 IMPLANT
ELECT REM PT RETURN 9FT ADLT (ELECTROSURGICAL) ×3
ELECTRODE BLDE 4.0 EZ CLN MEGD (MISCELLANEOUS) ×1 IMPLANT
ELECTRODE REM PT RTRN 9FT ADLT (ELECTROSURGICAL) ×1 IMPLANT
EVACUATOR SILICONE 100CC (DRAIN) ×3 IMPLANT
GAUZE SPONGE 4X4 12PLY STRL (GAUZE/BANDAGES/DRESSINGS) ×3 IMPLANT
GLOVE BIO SURGEON STRL SZ7 (GLOVE) ×3 IMPLANT
GLOVE BIOGEL PI IND STRL 7.5 (GLOVE) ×1 IMPLANT
GLOVE BIOGEL PI INDICATOR 7.5 (GLOVE) ×2
GOWN STRL REUS W/ TWL LRG LVL3 (GOWN DISPOSABLE) ×3 IMPLANT
GOWN STRL REUS W/TWL LRG LVL3 (GOWN DISPOSABLE) ×6
KIT BASIN OR (CUSTOM PROCEDURE TRAY) ×3 IMPLANT
KIT TURNOVER KIT B (KITS) ×3 IMPLANT
MARKER SKIN DUAL TIP RULER LAB (MISCELLANEOUS) ×3 IMPLANT
NEEDLE 18GX1X1/2 (RX/OR ONLY) (NEEDLE) IMPLANT
NEEDLE FILTER BLUNT 18X 1/2SAF (NEEDLE)
NEEDLE FILTER BLUNT 18X1 1/2 (NEEDLE) IMPLANT
NEEDLE HYPO 25GX1X1/2 BEV (NEEDLE) IMPLANT
NS IRRIG 1000ML POUR BTL (IV SOLUTION) ×3 IMPLANT
PACK GENERAL/GYN (CUSTOM PROCEDURE TRAY) ×3 IMPLANT
PAD ABD 8X10 STRL (GAUZE/BANDAGES/DRESSINGS) ×3 IMPLANT
PAD ARMBOARD 7.5X6 YLW CONV (MISCELLANEOUS) ×3 IMPLANT
PENCIL SMOKE EVACUATOR (MISCELLANEOUS) ×3 IMPLANT
PIN SAFETY STERILE (MISCELLANEOUS) ×3 IMPLANT
SPECIMEN JAR X LARGE (MISCELLANEOUS) ×3 IMPLANT
STAPLER VISISTAT 35W (STAPLE) ×3 IMPLANT
STRIP CLOSURE SKIN 1/2X4 (GAUZE/BANDAGES/DRESSINGS) ×2 IMPLANT
SUT ETHILON 2 0 FS 18 (SUTURE) ×3 IMPLANT
SUT MON AB 4-0 PC3 18 (SUTURE) ×3 IMPLANT
SUT SILK 2 0 SH (SUTURE) ×3 IMPLANT
SUT VIC AB 2-0 SH 18 (SUTURE) ×6 IMPLANT
SUT VIC AB 3-0 54X BRD REEL (SUTURE) ×1 IMPLANT
SUT VIC AB 3-0 BRD 54 (SUTURE) ×2
SUT VIC AB 3-0 SH 18 (SUTURE) ×3 IMPLANT
SUT VIC AB 3-0 SH 8-18 (SUTURE) ×3 IMPLANT
SYR CONTROL 10ML LL (SYRINGE) IMPLANT
TOWEL OR 17X24 6PK STRL BLUE (TOWEL DISPOSABLE) ×3 IMPLANT
TOWEL OR 17X26 10 PK STRL BLUE (TOWEL DISPOSABLE) ×3 IMPLANT

## 2019-01-08 NOTE — Anesthesia Preprocedure Evaluation (Signed)
Anesthesia Evaluation  Patient identified by MRN, date of birth, ID band Patient awake    Reviewed: Allergy & Precautions, NPO status , Patient's Chart, lab work & pertinent test results  Airway Mallampati: II  TM Distance: >3 FB Neck ROM: Full    Dental no notable dental hx. (+) Teeth Intact   Pulmonary neg pulmonary ROS,    Pulmonary exam normal breath sounds clear to auscultation       Cardiovascular hypertension, Pt. on medications Normal cardiovascular exam Rhythm:Regular Rate:Normal     Neuro/Psych negative neurological ROS  negative psych ROS   GI/Hepatic Neg liver ROS, GERD  Medicated,  Endo/Other  negative endocrine ROSRight Breast Ca Hx/o left breast Ca  Renal/GU negative Renal ROS  negative genitourinary   Musculoskeletal negative musculoskeletal ROS (+)   Abdominal   Peds  Hematology negative hematology ROS (+)   Anesthesia Other Findings   Reproductive/Obstetrics                             Anesthesia Physical Anesthesia Plan  ASA: II  Anesthesia Plan: General   Post-op Pain Management:  Regional for Post-op pain   Induction: Intravenous  PONV Risk Score and Plan: 3 and Ondansetron, Dexamethasone, Treatment may vary due to age or medical condition and Scopolamine patch - Pre-op  Airway Management Planned: Oral ETT  Additional Equipment:   Intra-op Plan:   Post-operative Plan: Extubation in OR  Informed Consent: I have reviewed the patients History and Physical, chart, labs and discussed the procedure including the risks, benefits and alternatives for the proposed anesthesia with the patient or authorized representative who has indicated his/her understanding and acceptance.     Dental advisory given  Plan Discussed with: CRNA and Surgeon  Anesthesia Plan Comments:         Anesthesia Quick Evaluation

## 2019-01-08 NOTE — Transfer of Care (Signed)
Immediate Anesthesia Transfer of Care Note  Patient: Gloria Mitchell  Procedure(s) Performed: BILATERAL TOTAL MASTECTOMIES WITH RIGHT AXILLARY SENTINEL LYMPH NODE BIOPSY (Bilateral Breast)  Patient Location: PACU  Anesthesia Type:GA combined with regional for post-op pain  Level of Consciousness: awake, alert  and oriented  Airway & Oxygen Therapy: Patient Spontanous Breathing and Patient connected to nasal cannula oxygen  Post-op Assessment: Report given to RN and Post -op Vital signs reviewed and stable  Post vital signs: Reviewed and stable  Last Vitals:  Vitals Value Taken Time  BP 134/55 01/08/2019  1:00 PM  Temp    Pulse 74 01/08/2019  1:02 PM  Resp 29 01/08/2019  1:02 PM  SpO2 91 % 01/08/2019  1:02 PM  Vitals shown include unvalidated device data.  Last Pain:  Vitals:   01/08/19 0908  TempSrc: Oral  PainSc: 0-No pain         Complications: No apparent anesthesia complications

## 2019-01-08 NOTE — Interval H&P Note (Deleted)
History and Physical Interval Note:  01/08/2019 10:20 AM  Gloria Mitchell  has presented today for surgery, with the diagnosis of bilateral right cancer.  The various methods of treatment have been discussed with the patient and family. After consideration of risks, benefits and other options for treatment, the patient has consented to  Procedure(s): BILATERAL TOTAL MASTECTOMIES WITH RIGHT AXILLARY SENTINEL LYMPH NODE BIOPSY (Bilateral) as a surgical intervention.  The patient's history has been reviewed, patient examined, no change in status, stable for surgery.  I have reviewed the patient's chart and labs.  Questions were answered to the patient's satisfaction.     Rolm Bookbinder

## 2019-01-08 NOTE — H&P (Deleted)
15 yof referred by Dr Ernie Hew for new right breast cancer. 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 this was treated with lumpectomy/sn biopsy/radiotherapy and then 5 years of tamoxifen. she has been fine since then. Her husband passed away and she moved here. she takes care of her mother at home. her sister from [place] is here with her today. she has no dc. she palpated a ruoq mass. she has fh of mom in her 5s. her right breast mass is 4.3 cm in size, axilla negative by [place]. she also has a small 6 mm nodule near scar on left that was biopsied. this was fcc and discordant. 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 she is here today to discuss options  Past Surgical History  Breast Biopsy  Bilateral. Mastectomy  Bilateral.  Diagnostic Studies History  Colonoscopy  >10 years ago Mammogram  within last year Pap Smear  1-5 years ago  Allergies  Sulfa Antibiotics   Medication History Medications Reconciled hydroCHLOROthiazide (12.5MG Capsule, Oral) Active. Lisinopril (10MG Tablet, Oral) Active.  Social History  Caffeine use  Coffee. No alcohol use  Tobacco use  Never smoker.  Family History Arthritis  Father, Mother. Breast Cancer  Mother. Cervical Cancer  Family Members In General. Hypertension  Mother. Migraine Headache  Mother. Rectal Cancer  Family Members In General. Thyroid problems  Mother.  Pregnancy / Birth History  Age at menarche  36 years. Age of menopause  51-50 Gravida  2 Maternal age  45-30 Para  0  Other Problems  Asthma  Breast Cancer  Cancer  Gastroesophageal Reflux Disease  High blood pressure  Lump In Breast    Review of Systems General Not Present- Appetite Loss, Chills, Fatigue, Fever, Night Sweats, Weight Gain and Weight Loss. Skin Not Present- Change in Wart/Mole, Dryness, Hives, Jaundice, New Lesions, Non-Healing Wounds, Rash and Ulcer. HEENT  Present- Seasonal Allergies and Wears glasses/contact lenses. Not Present- Earache, Hearing Loss, Hoarseness, Nose Bleed, Oral Ulcers, Ringing in the Ears, Sinus Pain, Sore Throat, Visual Disturbances and Yellow Eyes. Respiratory Not Present- Bloody sputum, Chronic Cough, Difficulty Breathing, Snoring and Wheezing. Breast Not Present- Breast Mass, Breast Pain, Nipple Discharge and Skin Changes. Cardiovascular Not Present- Chest Pain, Difficulty Breathing Lying Down, Leg Cramps, Palpitations, Rapid Heart Rate, Shortness of Breath and Swelling of Extremities. Gastrointestinal Not Present- Abdominal Pain, Bloating, Bloody Stool, Change in Bowel Habits, Chronic diarrhea, Constipation, Difficulty Swallowing, Excessive gas, Gets full quickly at meals, Hemorrhoids, Indigestion, Nausea, Rectal Pain and Vomiting. Female Genitourinary Not Present- Frequency, Nocturia, Painful Urination, Pelvic Pain and Urgency. Musculoskeletal Not Present- Back Pain, Joint Pain, Joint Stiffness, Muscle Pain, Muscle Weakness and Swelling of Extremities. Neurological Not Present- Decreased Memory, Fainting, Headaches, Numbness, Seizures, Tingling, Tremor, Trouble walking and Weakness. Psychiatric Not Present- Anxiety, Bipolar, Change in Sleep Pattern, Depression, Fearful and Frequent crying. Endocrine Present- Hot flashes. Not Present- Cold Intolerance, Excessive Hunger, Hair Changes, Heat Intolerance and New Diabetes. Hematology Not Present- Blood Thinners, Easy Bruising, Excessive bleeding, Gland problems, HIV and Persistent Infections.   Physical Exam  General Mental Status-Alert. Head and Neck Trachea-midline. Thyroid Gland Characteristics - normal size and consistency. Eye Sclera/Conjunctiva - Bilateral-No scleral icterus. Chest and Lung Exam Chest and lung exam reveals -quiet, even and easy respiratory effort with no use of accessory muscles and on auscultation, normal breath sounds, no adventitious sounds  and normal vocal resonance. Breast Nipples-No Discharge. Note: right breast  upper outer quadrant mass mobile, measures about 4 cm Cardiovascular Cardiovascular examination reveals -normal heart sounds, regular rate and rhythm with no murmurs. Abdomen Note: soft nontender no hepatomegaly Neurologic Neurologic evaluation reveals -alert and oriented x 3 with no impairment of recent or remote memory. Lymphatic Head & Neck General Head & Neck Lymphatics: Bilateral - Description - Normal. Axillary General Axillary Region: Bilateral - Description - Normal. Note: no [place] adenopathy   Assessment & Plan  BREAST CANCER OF UPPER-OUTER QUADRANT OF RIGHT FEMALE BREAST (C50.411) Story: Right mastectomy, right axillary sn biopsy, left mastectomy We discussed the staging and pathophysiology of breast cancer. We discussed all of the different options for treatment for breast cancer including surgery, chemotherapy, radiation therapy, Herceptin, and antiestrogen therapy. We discussed a sentinel lymph node biopsy as she does not appear to having lymph node involvement right now. We discussed the performance of that with injection of radioactive tracer. We discussed that there is a chance of having a positive node with a sentinel lymph node biopsy and we will await the permanent pathology to make any other first further decisions in terms of her treatment. One of these options might be to return to the operating room to perform an axillary lymph node dissection. We discussed up to a 5% risk lifetime of chronic shoulder pain as well as lymphedema associated with a sentinel lymph node biopsy. I think she needs a mastectomy on the right for the size of the mass. I dont think lumpectomy is a possibility. we discussed primary antiestrogens but she is not interested. she does not desire reconstruction either. will plan for mastectomy and sn on right. at minimum she needs excision of discordant abnormality on the  left but she would really prefer to have a mastectomy and I think this is reasonable given her history.

## 2019-01-08 NOTE — Op Note (Signed)
Preoperative diagnosis: right breast cancer, history of left breast cancer Postoperative diagnosis: same as above Procedure: 1. Right total mastectomy 2. Right deep axillary sentinel node biopsy 3. Left total mastectomy Surgeon: Dr Serita Grammes Anesthesia: general with pectoral block EBL: 50 cc Drains 19 Fr Blake to either side Specimens: #1 left total mastectomy marked short stitch superior, long stitch lateral 2.  Right total mastectomy marked short superior, long lateral 3.  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  She now has a clinical stage II right breast cancer.  This was a large enough mass that she was going necessitate a mastectomy.  She desired a mastectomy on the other side as she does have a discordant lesion present at her old scar.  We also discussed the sentinel node biopsy.  Procedure: After informed consent was obtained the patient her first underwent bilateral pectoral blocks.  She was given antibiotics.  SCDs were in place.  She was then placed under general anesthesia without complication.  She was prepped and draped in the standard sterile surgical fashion.  A surgical timeout was then performed.  I first did the left mastectomy.  I made an elliptical incision encompassing the nipple and the areola as well as her prior incision.  I created flaps to the parasternal area, inframammary fold, clavicle, latissimus laterally.  I then remove the breast with some difficulty due to the prior radiation from the pectoralis muscle to include the fascia.  This was marked and passed off the table.  Hemostasis was obtained.  I inserted a 8 [place] Blake drain and secured this with a 2-0 nylon suture.  I then closed the  lateral tissue to the chest wall and muscle with 2-0 Vicryl.  The skin was then closed with 3-0 Vicryl, 4-0 Monocryl, and glue.  I then turned my attention to the other side.  I made a similar elliptical incision to encompass the nipple and the areola as well as the tumor.  I created flaps in a similar fashion.  The breast and the pectoralis fascia were then removed from the pectoralis muscle and marked as above.  I then entered into the axilla.  I used the neoprobe to guide me to remove what appeared to be several small sentinel lymph nodes that were radioactive.  The highest count as listed as above.  There was no background radioactivity.  I closed the axilla with 2-0 Vicryl suture.  I then obtained hemostasis.  I then inserted a 45 [place] Blake drain and secured this with a 2-0 nylon suture.  I then closed this with 3-0 Vicryl, 4-0 Monocryl, and glue.  She tolerated this well was extubated transferred recovery room stable.

## 2019-01-08 NOTE — Interval H&P Note (Signed)
History and Physical Interval Note:  01/08/2019 10:26 AM  Gloria Mitchell  has presented today for surgery, with the diagnosis of bilateral right cancer.  The various methods of treatment have been discussed with the patient and family. After consideration of risks, benefits and other options for treatment, the patient has consented to  Procedure(s): BILATERAL TOTAL MASTECTOMIES WITH RIGHT AXILLARY SENTINEL LYMPH NODE BIOPSY (Bilateral) as a surgical intervention.  The patient's history has been reviewed, patient examined, no change in status, stable for surgery.  I have reviewed the patient's chart and labs.  Questions were answered to the patient's satisfaction.     Rolm Bookbinder

## 2019-01-08 NOTE — Discharge Instructions (Signed)
[facility] °[phone] ° °MASTECTOMY: POST OP INSTRUCTIONS °Take 400 mg of ibuprofen every 8 hours or 650 mg tylenol every 6 hours for next 72 hours then as needed. Use ice several times daily also. °Always review your discharge instruction sheet given to you by the facility where your surgery was performed. ° °IF YOU HAVE DISABILITY OR FAMILY LEAVE FORMS, YOU MUST BRING THEM TO THE OFFICE FOR PROCESSING.   °DO NOT GIVE THEM TO YOUR DOCTOR. °A prescription for pain medication may be given to you upon discharge.  Take your pain medication as prescribed, if needed.  If narcotic pain medicine is not needed, then you may take acetaminophen (Tylenol), naprosyn (Alleve) or ibuprofen (Advil) as needed. °1. Take your usually prescribed medications unless otherwise directed. °2. If you need a refill on your pain medication, please contact your pharmacy.  They will contact our office to request authorization.  Prescriptions will not be filled after 5pm or on week-ends. °3. You should follow a light diet the first few days after arrival home, such as soup and crackers, etc.  Resume your normal diet the day after surgery. °4. Most patients will experience some swelling and bruising on the chest and underarm.  Ice packs will help.  Swelling and bruising can take several days to resolve. Wear the binder day and night until you return to the office.  °5. It is common to experience some constipation if taking pain medication after surgery.  Increasing fluid intake and taking a stool softener (such as Colace) will usually help or prevent this problem from occurring.  A mild laxative (Milk of Magnesia or Miralax) should be taken according to package instructions if there are no bowel movements after 48 hours. °6. Unless discharge instructions indicate otherwise, leave your bandage dry and in place until your next appointment in 3-5 days.  You may take a limited sponge bath.  No tube baths or showers until the  drains are removed.  You may have steri-strips (small skin tapes) in place directly over the incision.  These strips should be left on the skin for 7-10 days. If you have glue it will come off in next couple week.  Any sutures will be removed at an office visit °7. DRAINS:  If you have drains in place, it is important to keep a list of the amount of drainage produced each day in your drains.  Before leaving the hospital, you should be instructed on drain care.  Call our office if you have any questions about your drains. I will remove your drains when they put out less than 30 cc or ml for 2 consecutive days. °8. ACTIVITIES:  You may resume regular (light) daily activities beginning the next day--such as daily self-care, walking, climbing stairs--gradually increasing activities as tolerated.  You may have sexual intercourse when it is comfortable.  Refrain from any heavy lifting or straining until approved by your doctor. °a. You may drive when you are no longer taking prescription pain medication, you can comfortably wear a seatbelt, and you can safely maneuver your car and apply brakes. °b. RETURN TO WORK:  __________________________________________________________ °9. You should see your doctor in the office for a follow-up appointment approximately 3-5 days after your surgery.  Your doctor’s nurse will typically make your follow-up appointment when she calls you with your pathology report.  Expect your pathology report 3-4business days after surgery. °10. OTHER INSTRUCTIONS: ______________________________________________________________________________________________ ____________________________________________________________________________________________ °WHEN TO CALL YOUR DR Rashida Ladouceur: °1. Fever over 101.0 °  2. Nausea and/or vomiting °3. Extreme swelling or bruising °4. Continued bleeding from incision. °5. Increased pain, redness, or drainage from the incision. °The clinic staff is available to answer your  questions during regular business hours.  Please don’t hesitate to call and ask to speak to one of the nurses for clinical concerns.  If you have a medical emergency, go to the nearest emergency room or call 911.  A surgeon from [facility] is always on call at the hospital. °[address], [address], [place], [place]  [address] ? [address] [address], [place], [place]   [address] °[phone] ? [phone] ? FAX [phone] °Web site: [email or web address] ° °

## 2019-01-08 NOTE — Anesthesia Postprocedure Evaluation (Signed)
Anesthesia Post Note  Patient: Gloria Mitchell  Procedure(s) Performed: BILATERAL TOTAL MASTECTOMIES WITH RIGHT AXILLARY SENTINEL LYMPH NODE BIOPSY (Bilateral Breast)     Patient location during evaluation: PACU Anesthesia Type: General Level of consciousness: awake and alert Pain management: pain level controlled Vital Signs Assessment: post-procedure vital signs reviewed and stable Respiratory status: spontaneous breathing, nonlabored ventilation and respiratory function stable Cardiovascular status: blood pressure returned to baseline and stable Postop Assessment: no apparent nausea or vomiting Anesthetic complications: no    Last Vitals:  Vitals:   01/08/19 1344 01/08/19 1345  BP: (!) 103/44   Pulse: (!) 59 (!) 58  Resp: 12 12  Temp:    SpO2: 99% 99%    Last Pain:  Vitals:   01/08/19 1400  TempSrc:   PainSc: Asleep                 [name] A.

## 2019-01-08 NOTE — Interval H&P Note (Deleted)
History and Physical Interval Note:  01/08/2019 10:24 AM  Gloria Mitchell  has presented today for surgery, with the diagnosis of bilateral right cancer.  The various methods of treatment have been discussed with the patient and family. After consideration of risks, benefits and other options for treatment, the patient has consented to  Procedure(s): BILATERAL TOTAL MASTECTOMIES WITH RIGHT AXILLARY SENTINEL LYMPH NODE BIOPSY (Bilateral) as a surgical intervention.  The patient's history has been reviewed, patient examined, no change in status, stable for surgery.  I have reviewed the patient's chart and labs.  Questions were answered to the patient's satisfaction.     Rolm Bookbinder

## 2019-01-08 NOTE — Anesthesia Procedure Notes (Signed)
Procedure Name: Intubation Date/Time: 01/08/2019 10:38 AM Performed by: Candis Shine, CRNA Pre-anesthesia Checklist: Patient identified, Emergency Drugs available, Suction available and Patient being monitored Patient Re-evaluated:Patient Re-evaluated prior to induction Oxygen Delivery Method: Circle System Utilized Preoxygenation: Pre-oxygenation with 100% oxygen Induction Type: IV induction Ventilation: Mask ventilation without difficulty and Oral airway inserted - appropriate to patient size Laryngoscope Size: Mac and 3 Grade View: Grade I Tube type: Oral Tube size: 7.0 mm Number of attempts: 1 Airway Equipment and Method: Stylet and Oral airway Placement Confirmation: ETT inserted through vocal cords under direct vision,  positive ETCO2 and breath sounds checked- equal and bilateral Secured at: 22 cm Tube secured with: Tape Dental Injury: Teeth and Oropharynx as per pre-operative assessment

## 2019-01-08 NOTE — Progress Notes (Signed)
Arrived to 6n16 from PACU at this time. Denies nausea/pain

## 2019-01-08 NOTE — Anesthesia Procedure Notes (Addendum)
Anesthesia Regional Block: Pectoralis block   Pre-Anesthetic Checklist: ,, timeout performed, Correct Patient, Correct Site, Correct Laterality, Correct Procedure, Correct Position, site marked, Risks and benefits discussed,  Surgical consent,  Pre-op evaluation,  At surgeon's request and post-op pain management  Laterality: Right  Prep: chloraprep       Needles:  Injection technique: Single-shot  Needle Type: Echogenic Stimulator Needle     Needle Length: 9cm  Needle Gauge: 21   Needle insertion depth: 8 cm   Additional Needles:   Procedures:,,,, ultrasound used (permanent image in chart),,,,  Narrative:  Start time: 01/08/2019 9:49 AM End time: 01/08/2019 9:54 AM Injection made incrementally with aspirations every 5 mL.  Performed by: Personally  Anesthesiologist: Josephine Igo, MD  Additional Notes: Timeout performed. Patient sedated. Relevant anatomy ID'd using [place]. Incremental 2-12ml injection of LA with frequent aspiration. Patient tolerated procedure well.        Right Pectoralis Block

## 2019-01-08 NOTE — Anesthesia Procedure Notes (Addendum)
Anesthesia Regional Block: Pectoralis block   Pre-Anesthetic Checklist: ,, timeout performed, Correct Patient, Correct Site, Correct Laterality, Correct Procedure, Correct Position, site marked, Risks and benefits discussed,  Surgical consent,  Pre-op evaluation,  At surgeon's request and post-op pain management  Laterality: Left  Prep: chloraprep       Needles:  Injection technique: Single-shot  Needle Type: Echogenic Stimulator Needle     Needle Length: 9cm  Needle Gauge: 21   Needle insertion depth: 8 cm   Additional Needles:   Procedures:,,,, ultrasound used (permanent image in chart),,,,  Narrative:  Start time: 01/08/2019 9:55 AM End time: 01/08/2019 10:00 AM Injection made incrementally with aspirations every 5 mL.  Performed by: Personally  Anesthesiologist: Josephine Igo, MD  Additional Notes: Timeout performed. Patient sedated. Relevant anatomy ID'd using [place]. Incremental 2-84ml injection of LA with frequent aspiration. Patient tolerated procedure well.        Left Pectoralis Block

## 2019-01-08 NOTE — H&P (Signed)
62 yof referred by Dr Dewey for new right breast cancer. 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 this was treated with lumpectomy/sn biopsy/radiotherapy and then 5 years of tamoxifen. she has been fine since then. Her husband passed away and she moved here. she takes care of her mother at home. her sister from [place] is here with her today. she has no dc. she palpated a ruoq mass. she has fh of mom in her 40s. her right breast mass is 4.3 cm in size, axilla negative by us. she also has a small 6 mm nodule near scar on left that was biopsied. this was fcc and discordant. 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 she is here today to discuss options  Past Surgical History  Breast Biopsy  Bilateral. Mastectomy  Bilateral.  Diagnostic Studies History  Colonoscopy  >10 years ago Mammogram  within last year Pap Smear  1-5 years ago  Allergies  Sulfa Antibiotics   Medication History Medications Reconciled hydroCHLOROthiazide (12.5MG Capsule, Oral) Active. Lisinopril (10MG Tablet, Oral) Active.  Social History  Caffeine use  Coffee. No alcohol use  Tobacco use  Never smoker.  Family History Arthritis  Father, Mother. Breast Cancer  Mother. Cervical Cancer  Family Members In General. Hypertension  Mother. Migraine Headache  Mother. Rectal Cancer  Family Members In General. Thyroid problems  Mother.  Pregnancy / Birth History  Age at menarche  9 years. Age of menopause  46-50 Gravida  2 Maternal age  26-30 Para  0  Other Problems  Asthma  Breast Cancer  Cancer  Gastroesophageal Reflux Disease  High blood pressure  Lump In Breast    Review of Systems General Not Present- Appetite Loss, Chills, Fatigue, Fever, Night Sweats, Weight Gain and Weight Loss. Skin Not Present- Change in Wart/Mole, Dryness, Hives, Jaundice, New Lesions, Non-Healing Wounds, Rash and Ulcer. HEENT  Present- Seasonal Allergies and Wears glasses/contact lenses. Not Present- Earache, Hearing Loss, Hoarseness, Nose Bleed, Oral Ulcers, Ringing in the Ears, Sinus Pain, Sore Throat, Visual Disturbances and Yellow Eyes. Respiratory Not Present- Bloody sputum, Chronic Cough, Difficulty Breathing, Snoring and Wheezing. Breast Not Present- Breast Mass, Breast Pain, Nipple Discharge and Skin Changes. Cardiovascular Not Present- Chest Pain, Difficulty Breathing Lying Down, Leg Cramps, Palpitations, Rapid Heart Rate, Shortness of Breath and Swelling of Extremities. Gastrointestinal Not Present- Abdominal Pain, Bloating, Bloody Stool, Change in Bowel Habits, Chronic diarrhea, Constipation, Difficulty Swallowing, Excessive gas, Gets full quickly at meals, Hemorrhoids, Indigestion, Nausea, Rectal Pain and Vomiting. Female Genitourinary Not Present- Frequency, Nocturia, Painful Urination, Pelvic Pain and Urgency. Musculoskeletal Not Present- Back Pain, Joint Pain, Joint Stiffness, Muscle Pain, Muscle Weakness and Swelling of Extremities. Neurological Not Present- Decreased Memory, Fainting, Headaches, Numbness, Seizures, Tingling, Tremor, Trouble walking and Weakness. Psychiatric Not Present- Anxiety, Bipolar, Change in Sleep Pattern, Depression, Fearful and Frequent crying. Endocrine Present- Hot flashes. Not Present- Cold Intolerance, Excessive Hunger, Hair Changes, Heat Intolerance and New Diabetes. Hematology Not Present- Blood Thinners, Easy Bruising, Excessive bleeding, Gland problems, HIV and Persistent Infections.   Physical Exam  General Mental Status-Alert. Head and Neck Trachea-midline. Thyroid Gland Characteristics - normal size and consistency. Eye Sclera/Conjunctiva - Bilateral-No scleral icterus. Chest and Lung Exam Chest and lung exam reveals -quiet, even and easy respiratory effort with no use of accessory muscles and on auscultation, normal breath sounds, no adventitious sounds  and normal vocal resonance. Breast Nipples-No Discharge. Note: right breast   upper outer quadrant mass mobile, measures about 4 cm Cardiovascular Cardiovascular examination reveals -normal heart sounds, regular rate and rhythm with no murmurs. Abdomen Note: soft nontender no hepatomegaly Neurologic Neurologic evaluation reveals -alert and oriented x 3 with no impairment of recent or remote memory. Lymphatic Head & Neck General Head & Neck Lymphatics: Bilateral - Description - Normal. Axillary General Axillary Region: Bilateral - Description - Normal. Note: no [place] adenopathy   Assessment & Plan  BREAST CANCER OF UPPER-OUTER QUADRANT OF RIGHT FEMALE BREAST (C50.411) Story: Right mastectomy, right axillary sn biopsy, left mastectomy We discussed the staging and pathophysiology of breast cancer. We discussed all of the different options for treatment for breast cancer including surgery, chemotherapy, radiation therapy, Herceptin, and antiestrogen therapy. We discussed a sentinel lymph node biopsy as she does not appear to having lymph node involvement right now. We discussed the performance of that with injection of radioactive tracer. We discussed that there is a chance of having a positive node with a sentinel lymph node biopsy and we will await the permanent pathology to make any other first further decisions in terms of her treatment. One of these options might be to return to the operating room to perform an axillary lymph node dissection. We discussed up to a 5% risk lifetime of chronic shoulder pain as well as lymphedema associated with a sentinel lymph node biopsy. I think she needs a mastectomy on the right for the size of the mass. I dont think lumpectomy is a possibility. we discussed primary antiestrogens but she is not interested. she does not desire reconstruction either. will plan for mastectomy and sn on right. at minimum she needs excision of discordant abnormality on the  left but she would really prefer to have a mastectomy and I think this is reasonable given her history. 

## 2019-01-09 ENCOUNTER — Encounter (HOSPITAL_COMMUNITY): Payer: Self-pay | Admitting: General Surgery

## 2019-01-09 DIAGNOSIS — C50411 Malignant neoplasm of upper-outer quadrant of right female breast: Secondary | ICD-10-CM | POA: Diagnosis not present

## 2019-01-09 MED ORDER — METHOCARBAMOL 500 MG PO TABS: 500.0000 mg | ORAL_TABLET | Freq: Three times a day (TID) | ORAL | 0 refills | Status: DC

## 2019-01-09 MED ORDER — TRAMADOL HCL 50 MG PO TABS: 50.0000 mg | ORAL_TABLET | Freq: Four times a day (QID) | ORAL | 0 refills | Status: DC | PRN

## 2019-01-09 NOTE — Discharge Summary (Signed)
Physician Discharge Summary  Patient ID: Gloria Mitchell MRN: [id number] DOB/AGE: [birth year] 63 y.o.  Admit date: 01/08/2019 Discharge date: 01/09/2019  Admission Diagnoses: Right breast cancer History of left breast cancer  Discharge Diagnoses:  Active Problems:   Breast cancer, right [facility])   Discharged Condition: good  Hospital Course: 62 yof s/p bilateral total mastectomies and right ax sn biopsy. She has done well and will be discharged home  Consults: None  Significant Diagnostic Studies: none  Treatments: surgery: right tm, left tm, right ax sn biopsy  Discharge Exam: Blood pressure (!) 122/55, pulse 68, temperature 98 F (36.7 C), temperature source Oral, resp. rate 14, height 5\' 6"  (1.676 m), weight 75.8 kg, SpO2 96 %. Incisions clean, viable flaps, drains serosang  Disposition: Discharge disposition: 01-Home or Self Care        Allergies as of 01/09/2019      Reactions   Sulfa Antibiotics Anaphylaxis, Hives      Medication List    TAKE these medications   diphenhydrAMINE 25 mg capsule Commonly known as:  BENADRYL Take 25 mg by mouth every 8 (eight) hours as needed for allergies.   hydrochlorothiazide 12.5 MG capsule Commonly known as:  MICROZIDE Take 12.5 mg by mouth every Monday, Wednesday, and Friday.   lisinopril 20 MG tablet Commonly known as:  PRINIVIL,ZESTRIL Take 20 mg by mouth daily.   methocarbamol 500 MG tablet Commonly known as:  ROBAXIN Take 1 tablet (500 mg total) by mouth 3 (three) times daily.   traMADol 50 MG tablet Commonly known as:  ULTRAM Take 1 tablet (50 mg total) by mouth every 6 (six) hours as needed.   Zegerid 20-1100 MG Caps capsule Generic drug:  Omeprazole-Sodium Bicarbonate Take 1 capsule by mouth daily as needed (heartburn).      Follow-up Information    Rolm Bookbinder, MD In 2 weeks.   Specialty:  General Surgery Contact information: [address] [place] [address] [phone]            Signed: Rolm Bookbinder 01/09/2019, 6:53 AM

## 2019-01-09 NOTE — Progress Notes (Signed)
Pt for discharge going home, discontinued peripheral IV line, given health teachings on how to empty JP drain left and right breast, keep the breast binder on, record the output, next appointment, due med explained and understood, given all her personal belongings, no s/s of distress at this time, waiting for her family member to pick her up, able to eat her meals and ambulates.

## 2019-01-13 ENCOUNTER — Telehealth: Payer: Self-pay | Admitting: *Deleted

## 2019-01-13 NOTE — Telephone Encounter (Signed)
Ordered oncotype per Dr. Magrinat.  Faxed requisition to pathology and confirmed receipt.  

## 2019-01-21 ENCOUNTER — Encounter (HOSPITAL_COMMUNITY): Payer: Self-pay | Admitting: Oncology

## 2019-01-22 ENCOUNTER — Other Ambulatory Visit: Payer: Self-pay | Admitting: Oncology

## 2019-01-22 ENCOUNTER — Telehealth: Payer: Self-pay | Admitting: *Deleted

## 2019-01-22 NOTE — Telephone Encounter (Signed)
Received oncotype results of 11/3%.  Spoke to patient and she is aware.  She knows of her appointment on 3/30.

## 2019-01-23 NOTE — Progress Notes (Signed)
[facility]  [phone] [phone]     ID: Gloria Mitchell DOB: [birth year]  MR#: [id number]  KNL#:[id number]  Patient Care Team: Patient, No Pcp Per as PCP - General (General Practice) Rolm Bookbinder, MD as Consulting Physician (General Surgery) , [name], MD as Consulting Physician (Oncology) [name], MD as Attending Physician (Radiation Oncology) Rockwell Germany, RN as Oncology Nurse Navigator Mauro Kaufmann, RN as Oncology Nurse Navigator OTHER MD:   CHIEF COMPLAINT: Estrogen receptor positive breast cancer   CURRENT TREATMENT: Anastrozole   INTERVAL HISTORY: Gloria Mitchell was supposed to return today for follow up and treatment of her estrogen receptor positive breast cancer.  However because of the current pandemic restrictions we had the meeting over the phone.  We tried to WebEx it but it did not work  Since her last visit, she underwent bilateral total mastectomies with right axillary sentinel lymph node biopsy on 01/08/2019. Pathology from the procedure ([id number]) showed: left breast with atypical lobular hyperplasia; right breast with invasive micropapillary carcinoma, 3.8 cm, grade 2, margins not involved; both right axillary sentinel lymph nodes were negative for carcinoma (0/2).  She also underwent oncotype testing on 01/21/2019, with a resulting score of 11. Her distant recurrence risk at 9 years with aromatase inhibitor or tamoxifen alone is 3%, and her benefit from chemotherapy at less than 1%.  An order for genetics testing has been placed but visit is not yet scheduled.   REVIEW OF SYSTEMS: Gloria Mitchell reports no pain from her mastectomy.  She was "surprised" at how well she did and how well she is feeling.  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   More recently she presented with a palpable abnormality in the upper-outer right breast and in the upper-central left periareolar region. She underwent bilateral diagnostic mammography with tomography and bilateral breast ultrasonography at [facility] on 11/21/2018 showing: Breast Density Category C. In the right breast, examination demonstrates an oval circumscribed 4.4 cm mass over the upper outer region accounting for patient's palpable abnormality. Remainder of the right breast in unchanged. On physical exam, there is an oval 3.0 x 4.0 cm firm mobile mass over the 9:30 position of the right breast 7 cm from the nipple. Target ultrasound is performed, showing an oval predominately circumscribed isoechoic mass with parallel long axis at the 9:30 position of the right breast 7 cm from the nipple corresponding to patient's mammographic and palpable abnormality. This measures 1.9 x 3.3 x 4.3 cm. There is mild to moderate internal vascularity. Ultrasound of the right axilla is unremarkable.   Accordingly on 12/01/2018 she proceeded to biopsy of the right breast area in question. The pathology from this procedure showed ([id number]): invasive ductal carcinoma, grade 1-2. Prognostic indicators significant for: estrogen receptor, 100% positive and progesterone receptor, 100% positive, both with strong staining intensity. Proliferation marker Ki67 at 10%. HER2 equivocal (2+) by immunohistochemistry but negative by fluorescent in situ hybridization with a signals ratio 1.23 and number per cell 1.85.   The same mammogram showed no definite focal abnormality over the upper central left periareolar region to correspond to patient's palpable abnormality. There are stable post lumpectomy changes over the upper-outer left breast. No  focal abnormality over the upper central left periareolar region is  palpated on physical exam. Ultrasound over the upper central left periareolar region demonstrates an oval hypoechoic mass at the 1 o'clock position 1 cm from the nipple measuring 0.4 x 0.6 x 0.6 cm with possible intraductal extension. There is subtle border irregularity. Ultrasound of the left axilla is normal.   She also proceeded to biopsy of the left breast area in question on the same day. The pathology from this procedure showed ([id number]): fibrocystic changes to include stromal fibrosis.    The patient's subsequent history is as detailed below.   PAST MEDICAL HISTORY: Past Medical History:  Diagnosis Date  . Cancer ([facility]) 08/29/2007   Left DICS, stage 2  . Hypertension      PAST SURGICAL HISTORY: Past Surgical History:  Procedure Laterality Date  . BREAST LUMPECTOMY Left 09/09/2007  . CYST REMOVAL NECK    . MASTECTOMY W/ SENTINEL NODE BIOPSY Bilateral 01/08/2019   Procedure: BILATERAL TOTAL MASTECTOMIES WITH RIGHT AXILLARY SENTINEL LYMPH NODE BIOPSY;  Surgeon: Rolm Bookbinder, MD;  Location: [facility];  Service: General;  Laterality: Bilateral;     FAMILY HISTORY: Family History  Problem Relation Age of Onset  . Hypertension Mother   . Dementia Mother   . Breast cancer Mother        diagnosed in her 56's  . Breast cancer Maternal Aunt    As of February 2020, Gloria Mitchell's father is alive, but she is estranged from him. Jamarria has no information about her father or his family's medical information. Patients' mother is 42. The patient has 1 brother and 1 sister. The patient has a maternal aunt that was diagnosed with breast cancer at 64 years old. Patient denies anyone in her family having ovarian, prostate, or pancreatic cancer.    GYNECOLOGIC HISTORY:  No LMP recorded. Patient is postmenopausal. 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 She is widowed. She has no children. Gloria Mitchell lives with her mother. Gloria Mitchell does not attend a church, Glass blower/designer, or mosque.   ADVANCED DIRECTIVES: Not in place. Gloria Mitchell was given the appropriate health care power of attorney forms to fill out and return at her convenience.     HEALTH MAINTENANCE: Social History   Tobacco Use  . Smoking status: Never Smoker  . Smokeless tobacco: Never Used  Substance Use Topics  . Alcohol use: Not Currently  . Drug use: Never    Colonoscopy: yes, 12 years ago in [place]  PAP: yes, 11/20/2018  Bone density: yes, roughly 2010, osteopenia per patient   Allergies  Allergen Reactions  . Sulfa Antibiotics Anaphylaxis and Hives    Current Outpatient Medications  Medication Sig Dispense Refill  . diphenhydrAMINE (BENADRYL) 25 mg capsule Take 25 mg by mouth every 8 (eight) hours as needed for allergies.     . hydrochlorothiazide (MICROZIDE) 12.5 MG capsule Take 12.5 mg by mouth every Monday, Wednesday, and Friday.     Marland Kitchen lisinopril (PRINIVIL,ZESTRIL) 20 MG tablet Take 20 mg by mouth daily.     . methocarbamol (ROBAXIN) 500 MG tablet Take 1 tablet (500 mg total) by mouth 3 (three) times daily. 20 tablet 0  . Omeprazole-Sodium Bicarbonate (ZEGERID) 20-1100 MG CAPS capsule Take 1 capsule by mouth daily as needed (heartburn).    . traMADol (ULTRAM) 50 MG tablet Take 1 tablet (50  mg total) by mouth every 6 (six) hours as needed. 10 tablet 0   No current facility-administered medications for this visit.      OBJECTIVE: Middle-aged white woman   There were no vitals filed for this visit.   There is no height or weight on file to calculate BMI.   Wt Readings from Last 3 Encounters:  01/08/19 167 lb (75.8 kg)  12/29/18 166 lb 14.4 oz (75.7 kg)  12/10/18 168 lb 3.2 oz (76.3 kg)      ECOG FS:1 - Symptomatic but completely ambulatory    LAB RESULTS:  CMP     Component Value Date/Time   NA 139 12/29/2018 0850   K 4.0 12/29/2018 0850   CL 105  12/29/2018 0850   CO2 27 12/29/2018 0850   GLUCOSE 111 (H) 12/29/2018 0850   BUN 13 12/29/2018 0850   CREATININE 0.88 12/29/2018 0850   CREATININE 0.88 12/10/2018 0820   CALCIUM 9.5 12/29/2018 0850   PROT 7.2 12/10/2018 0820   ALBUMIN 3.9 12/10/2018 0820   AST 13 (L) 12/10/2018 0820   ALT 8 12/10/2018 0820   ALKPHOS 64 12/10/2018 0820   BILITOT 0.3 12/10/2018 0820   GFRNONAA >60 12/29/2018 0850   GFRNONAA >60 12/10/2018 0820   GFRAA >60 12/29/2018 0850   GFRAA >60 12/10/2018 0820    No results found for: TOTALPROTELP, ALBUMINELP, A1GS, A2GS, BETS, BETA2SER, GAMS, MSPIKE, SPEI  No results found for: KPAFRELGTCHN, LAMBDASER, KAPLAMBRATIO  Lab Results  Component Value Date   WBC 6.4 12/29/2018   NEUTROABS 3.1 12/10/2018   HGB 12.8 12/29/2018   HCT 40.8 12/29/2018   MCV 92.9 12/29/2018   PLT 284 12/29/2018    No results found for: LABCA2  No components found for: YHCWCB762  No results for input(s): INR in the last 168 hours.  No results found for: LABCA2  No results found for: GBT517  No results found for: OHY073  No results found for: XTG626  No results found for: CA2729  No components found for: HGQUANT  No results found for: CEA1 / No results found for: CEA1   No results found for: AFPTUMOR  No results found for: CHROMOGRNA  No results found for: PSA1  No visits with results within 3 Day(s) from this visit.  Latest known visit with results is:  Hospital Outpatient Visit on 12/29/2018  Component Date Value Ref Range Status  . WBC 12/29/2018 6.4  4.0 - 10.5 K/uL Final  . RBC 12/29/2018 4.39  3.87 - 5.11 MIL/uL Final  . Hemoglobin 12/29/2018 12.8  12.0 - 15.0 g/dL Final  . HCT 12/29/2018 40.8  36.0 - 46.0 % Final  . MCV 12/29/2018 92.9  80.0 - 100.0 fL Final  . MCH 12/29/2018 29.2  26.0 - 34.0 pg Final  . MCHC 12/29/2018 31.4  30.0 - 36.0 g/dL Final  . RDW 12/29/2018 12.9  11.5 - 15.5 % Final  . Platelets 12/29/2018 284  150 - 400 K/uL Final  . nRBC  12/29/2018 0.0  0.0 - 0.2 % Final   Performed at [facility], [place] [address], [place], [place] [address]  . Sodium 12/29/2018 139  135 - 145 mmol/L Final  . Potassium 12/29/2018 4.0  3.5 - 5.1 mmol/L Final  . Chloride 12/29/2018 105  98 - 111 mmol/L Final  . CO2 12/29/2018 27  22 - 32 mmol/L Final  . Glucose, Bld 12/29/2018 111* 70 - 99 mg/dL Final  . BUN 12/29/2018 13  8 -  23 mg/dL Final  . Creatinine, Ser 12/29/2018 0.88  0.44 - 1.00 mg/dL Final  . Calcium 12/29/2018 9.5  8.9 - 10.3 mg/dL Final  . GFR calc non Af Amer 12/29/2018 >60  >60 mL/min Final  . GFR calc Af Amer 12/29/2018 >60  >60 mL/min Final  . Anion gap 12/29/2018 7  5 - 15 Final   Performed at [facility], [place] [address], [place], [place] [address]    (this displays the last labs from the last 3 days)  No results found for: TOTALPROTELP, ALBUMINELP, A1GS, A2GS, BETS, BETA2SER, GAMS, MSPIKE, SPEI (this displays SPEP labs)  No results found for: KPAFRELGTCHN, LAMBDASER, KAPLAMBRATIO (kappa/lambda light chains)  No results found for: HGBA, HGBA2QUANT, HGBFQUANT, HGBSQUAN (Hemoglobinopathy evaluation)   No results found for: LDH  No results found for: IRON, TIBC, IRONPCTSAT (Iron and TIBC)  No results found for: FERRITIN  Urinalysis No results found for: COLORURINE, APPEARANCEUR, LABSPEC, PHURINE, GLUCOSEU, HGBUR, BILIRUBINUR, KETONESUR, PROTEINUR, UROBILINOGEN, NITRITE, LEUKOCYTESUR   STUDIES:  Nm Sentinel Node Inj-no Rpt (breast)  Result Date: 01/08/2019 Sulfur colloid was injected by the nuclear medicine technologist for melanoma sentinel node.     ELIGIBLE FOR AVAILABLE RESEARCH PROTOCOL: NO   ASSESSMENT: 63 y.o. 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   (1) status post bilateral mastectomies and right sentinel lymph node sampling 01/08/2019 showing:  (a) on the left, atypical lobular hyperplasia  (b) on the right, a T2 N0, stage IB invasive micropapillary carcinoma, grade 2, with negative margins; 2 right-sided sentinel lymph nodes sampled  (2) Oncotype score of 11 predicts a risk of recurrence outside the breast within the next 9 years of 3% if the patient only takes antiestrogens for 5 years, with no benefit from chemotherapy.  (3) adjuvant radiation not indicated  (4) anastrozole started 01/26/2019  (5) genetics testing pending   PLAN:  Gloria Mitchell did very well with her surgery and she will not need radiation treatments.  She is therefore ready for antiestrogens  Recall that she took tamoxifen for 5 years previously.  Accordingly at this point it would make sense to go to an aromatase inhibitor.  We discussed the possible toxicities side effects and complications of anastrozole in detail today I have gone ahead and placed the prescription in for her.  If she develops any symptoms she will let us know and likely we will switch to exemestane.  Otherwise the plan will be to continue anastrozole for a total of 5 years.  She tells me she had a bone density approximately 10 years ago and that it showed osteopenia.  If we do continue on the anastrozole the plan will be to repeat that after the next visit here.  I will see her again in 3 months.  She knows to call for any other issues that may develop before then.   , [name], MD  01/26/19 10:17 AM Medical Oncology and Hematology [facility] [address] [place], [place] [address] Tel. [phone]    Fax. [phone]    I, Wilburn Mylar, am acting as scribe for Dr. [name]. .  Lindie Spruce MD,  have reviewed the above documentation for accuracy  and completeness, and I agree with the above.

## 2019-01-26 ENCOUNTER — Inpatient Hospital Stay: Payer: Medicaid Other | Attending: Oncology | Admitting: Oncology

## 2019-01-26 DIAGNOSIS — C50411 Malignant neoplasm of upper-outer quadrant of right female breast: Secondary | ICD-10-CM

## 2019-01-26 DIAGNOSIS — Z170 Estrogen receptor positive status [ER+]: Secondary | ICD-10-CM

## 2019-01-26 MED ORDER — ANASTROZOLE 1 MG PO TABS: 1.0000 mg | ORAL_TABLET | Freq: Every day | ORAL | 4 refills | Status: DC

## 2019-02-04 ENCOUNTER — Ambulatory Visit: Payer: No Typology Code available for payment source | Admitting: Oncology

## 2019-02-05 ENCOUNTER — Telehealth: Payer: Self-pay | Admitting: Physical Therapy

## 2019-02-05 NOTE — Telephone Encounter (Signed)
Phoned pt to discuss if she feels it is urgent to come to her PT appt on 02/19/2019 as we are limiting visits during Covid19. She had no concerns about her shoulder ROM and felt she was progressing well s/p surgery. She was concerned about what she described as a pocket of fluid in her axilla just above where her surgical binder hits. Encouraged her to try a sports bra that is wide on the sides and zips up the front instead and she said she would. We also ordered free knitted knockers for her while she was on the phone so she has some prostheses until she can get fitted for them once stores reopen. She reported feeling overwhelmed because her mom who lives with her is dying and she acknowledged that she is not taking care of herself right now because she is focusing all her time on her Mom. Encouraged her to contact us if we can be of any assistance to her during this time. She requested we call her once we reopen so she can have a post op check of her arm circumference and range of motion. She knows to call with any concerns. Gloria Mitchell, [place] 02/05/19 3:31 PM

## 2019-02-10 ENCOUNTER — Telehealth: Payer: Self-pay | Admitting: *Deleted

## 2019-02-10 NOTE — Telephone Encounter (Signed)
Called, spoke with the patient regarding her appts for June and September. Patient stated "I will go ahead and schedule the appts, but know that my mom is in hospice. I don't know what will be going at that time. I may have to cancel." Told the patient that I would let them know. Message forwarded to Dr. Jana Hakim nurse. Per the patient's request I scheduled the appts with her sister.

## 2019-02-19 ENCOUNTER — Encounter: Payer: No Typology Code available for payment source | Admitting: Physical Therapy

## 2019-03-09 ENCOUNTER — Encounter (HOSPITAL_COMMUNITY): Payer: Self-pay | Admitting: *Deleted

## 2019-03-09 NOTE — Progress Notes (Signed)
Letter mailed to patient with negative pap smear results. HPV was negative. Next pap smear due in five years. 

## 2019-04-08 ENCOUNTER — Telehealth: Payer: Self-pay | Admitting: Oncology

## 2019-04-08 NOTE — Telephone Encounter (Signed)
Called patient per sch msg to reschedule 6/24 appt. Spoke with patient. Set a new date and time. Sent msg to nurse for med refill per patients request.

## 2019-04-12 ENCOUNTER — Other Ambulatory Visit: Payer: Self-pay | Admitting: Oncology

## 2019-04-22 ENCOUNTER — Ambulatory Visit: Payer: Medicaid Other | Admitting: Oncology

## 2019-04-28 ENCOUNTER — Ambulatory Visit: Payer: Medicaid Other | Attending: General Surgery | Admitting: Physical Therapy

## 2019-04-28 ENCOUNTER — Telehealth: Payer: Self-pay | Admitting: Oncology

## 2019-04-28 ENCOUNTER — Encounter: Payer: Self-pay | Admitting: Physical Therapy

## 2019-04-28 ENCOUNTER — Other Ambulatory Visit: Payer: Self-pay

## 2019-04-28 DIAGNOSIS — I972 Postmastectomy lymphedema syndrome: Secondary | ICD-10-CM | POA: Diagnosis present

## 2019-04-28 DIAGNOSIS — R293 Abnormal posture: Secondary | ICD-10-CM | POA: Insufficient documentation

## 2019-04-28 NOTE — Telephone Encounter (Signed)
GM PAL 7/20 moved f/u to 7/31 as webex. Confirmed with patient.

## 2019-04-28 NOTE — Therapy (Signed)
[place], [place], [address] Phone: [phone]   Fax:  [phone]  Physical Therapy Evaluation  Patient Details  Name: Gloria Mitchell MRN: [id number] Date of Birth: November 18, 1955 Referring Provider (PT): Dr. Rolm Bookbinder   Encounter Date: 04/28/2019  PT End of Session - 04/28/19 0914    Visit Number  1    Number of Visits  4    Date for PT Re-Evaluation  05/26/19    Authorization Type  Medicaid- sending for auth 04/28/19    PT Start Time  0833    PT Stop Time  0912    PT Time Calculation (min)  39 min    Activity Tolerance  Patient tolerated treatment well    Behavior During Therapy  [facility] for tasks assessed/performed       Past Medical History:  Diagnosis Date  . Cancer ([place]) 08/29/2007   Left DICS, stage 2  . Hypertension     Past Surgical History:  Procedure Laterality Date  . BREAST LUMPECTOMY Left 09/09/2007  . CYST REMOVAL NECK    . MASTECTOMY W/ SENTINEL NODE BIOPSY Bilateral 01/08/2019   Procedure: BILATERAL TOTAL MASTECTOMIES WITH RIGHT AXILLARY SENTINEL LYMPH NODE BIOPSY;  Surgeon: Rolm Bookbinder, MD;  Location: [place];  Service: General;  Laterality: Bilateral;    There were no vitals filed for this visit.   Subjective Assessment - 04/28/19 0837    Subjective  I have this seroma on my right side. It feels different on my side when I put my arm down. The compression bra I got is not working at all.    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 It measures 4.3 cm and is located in the upper outer quadrant. It is ER/PR positive, HER2 negative with a Ki67 of 10%. There is a place on the left breast that is likely scar tissue from previous breast surgery that measures 6 mm and is located in the upper outer quadrant. She has a previous left breast cancer history in 2008 and underwent a left lumpectomy, sentinel  node biopsy, radiation, and 5 years of Tamoxifen.    Patient Stated Goals  to get rid of the swelling    Currently in Pain?  No/denies         [facility] PT Assessment - 04/28/19 0001      Assessment   Medical Diagnosis  Right breast cancer    Referring Provider (PT)  Dr. Rolm Bookbinder    Onset Date/Surgical Date  01/08/19    Hand Dominance  Right    Prior Therapy  none      Precautions   Precautions  Other (comment)    Precaution Comments  at risk for lymphedema      Restrictions   Weight Bearing Restrictions  No      Balance Screen   Has the patient fallen in the past 6 months  No    Has the patient had a decrease in activity level because of a fear of falling?   No    Is the patient reluctant to leave their home because of a fear of falling?   No      Home Environment   Living Environment  Private residence    Living Arrangements  Parent   65 y.o. mother   Available Help at Discharge  Family      Prior Function   Level of Independence  Independent  Vocation  Retired    Leisure  She walks her dogs several times daily, she is a caregiver for her mother      Cognition   Overall Cognitive Status  Within Functional Limits for tasks assessed      Observation/Other Assessments   Observations  pt has incrased swelling on right lateral trunk that is soft and feels like lymphedema      Posture/Postural Control   Posture/Postural Control  Postural limitations    Postural Limitations  Rounded Shoulders;Forward head      AROM   Overall AROM   Within functional limits for tasks performed    Right Shoulder Extension  --    Right Shoulder Flexion  --    Right Shoulder ABduction  --    Right Shoulder Internal Rotation  --    Right Shoulder External Rotation  --    Left Shoulder Extension  --    Left Shoulder Flexion  --    Left Shoulder ABduction  --    Left Shoulder Internal Rotation  --    Left Shoulder External Rotation  --    Cervical Flexion  --    Cervical  Extension  --    Cervical - Right Side Bend  --    Cervical - Left Side Bend  --    Cervical - Right Rotation  --    Cervical - Left Rotation  --      Strength   Overall Strength  --        LYMPHEDEMA/ONCOLOGY QUESTIONNAIRE - 04/28/19 0846      Type   Cancer Type  Right breast cancer      Surgeries   Mastectomy Date  01/08/19    Sentinel Lymph Node Biopsy Date  01/08/19    Number Lymph Nodes Removed  2      Date Lymphedema/Swelling Started   Date  01/08/19      Treatment   Active Chemotherapy Treatment  No    Past Chemotherapy Treatment  No    Active Radiation Treatment  No    Past Radiation Treatment  No    Current Hormone Treatment  Yes    Past Hormone Therapy  No      What other symptoms do you have   Are you Having Heaviness or Tightness  Yes   across chest   Are you having Pain  No    Are you having pitting edema  No    Is it Hard or Difficult finding clothes that fit  No    Do you have infections  No    Is there Decreased scar mobility  No      Right Upper Extremity Lymphedema   10 cm Proximal to Olecranon Process  28.5 cm    Olecranon Process  24 cm    10 cm Proximal to Ulnar Styloid Process  20 cm    Just Proximal to Ulnar Styloid Process  16 cm    Across Hand at [employer]  19 cm    At [place] of 2nd Digit  6.2 cm      Left Upper Extremity Lymphedema   10 cm Proximal to Olecranon Process  28.9 cm    Olecranon Process  24 cm    10 cm Proximal to Ulnar Styloid Process  20.5 cm    Just Proximal to Ulnar Styloid Process  16.5 cm    Across Hand at [employer]  18 cm    At  [place] of 2nd Digit  6 cm             Objective measurements completed on examination: See above findings.      [facility] Adult PT Treatment/Exercise - 04/28/19 0001      Manual Therapy   Manual Therapy  Manual Lymphatic Drainage (MLD);Edema management    Edema Management  created chip pack for patient to wear in her bra    Manual Lymphatic Drainage (MLD)  briefly  performed MLD to R lateral trunk while verbally instructing pt in supine: 5 diaphragmatic breaths, short neck, right inguinal nodes and establishment of axillo inguinal pathway then working on R lateral trunk in area of increased swelling moving fluid towards pathway then finishing with R inguinal nodes             PT Education - 04/28/19 0914    Education Details  self MLD for R lateral trunk swelling, chip pack    Person(s) Educated  Patient    Methods  Explanation    Comprehension  Verbalized understanding;Returned demonstration          PT Long Term Goals - 04/28/19 0921      PT LONG TERM GOAL #1   Title  Pt will be independent in self MLD for long term management of right lateral trunk edema    Time  4    Period  Weeks    Status  New    Target Date  05/26/19      PT LONG TERM GOAL #2   Title  Pt will have appropriate compression garments for long term managemet of lymphedema    Time  4    Period  Weeks    Status  New    Target Date  05/26/19             Plan - 04/28/19 0915    Clinical Impression Statement  Pt presents to PT with R lateral trunk swelling that has been present since her bilateral mastectomy on 01/08/19 for treatment of right breast cancer. She did not require chemo or radiation but is taking a hormone pill. Pt reports her swelling feels awkward compared to the left side. She has no evidence of swelling in her RUE and her ROM is [facility]. Began instructing pt today in self MLD for this area. Issued chip pack to wear in tank top for additional compression and instructed pt to wear her compression binder over this. Pt would benefit from skilled PT services to decrease right lateral trunk swelling and progress pt towards independence with managing this. She would also benefit from some pec stretches for tightness she feels across chest with arms outstretcted.    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    Stability/Clinical Decision Making  Stable/Uncomplicated    Clinical Decision Making  Low    Rehab Potential  Excellent    Clinical Impairments Affecting Rehab Potential  None    PT Frequency  Other (comment)   3 times per Josem Kaufmann   PT Duration  4 weeks    PT Treatment/Interventions  ADLs/Self Care Home Management;Patient/family education;Therapeutic exercise;Manual lymph drainage;Taping;Compression bandaging    PT Next Visit Plan  continue instructing in MLD for R lateral trunk and issue handout, give pec stretch- lying over foam roll    PT Home Exercise Plan  self MLD and chip pack    Consulted and Agree with Plan  of Care  Patient       Patient will benefit from skilled therapeutic intervention in order to improve the following deficits and impairments:  Increased edema, Increased fascial restricitons  Visit Diagnosis: 1. Postmastectomy lymphedema   2. Abnormal posture        Problem List Patient Active Problem List   Diagnosis Date Noted  . Malignant neoplasm of upper-outer quadrant of right breast in female, estrogen receptor positive ([place]) 12/08/2018    Allyson Sabal [facility] 04/28/2019, [place], [place], [address] Phone: [phone]   Fax:  [phone]  Name: Gloria Mitchell MRN: [id number] Date of Birth: Jun 20, 1956  Manus Gunning, PT 04/28/19 9:22 AM

## 2019-05-07 ENCOUNTER — Encounter: Payer: Self-pay | Admitting: Physical Therapy

## 2019-05-07 ENCOUNTER — Ambulatory Visit: Payer: Medicaid Other | Attending: General Surgery | Admitting: Physical Therapy

## 2019-05-07 ENCOUNTER — Other Ambulatory Visit: Payer: Self-pay

## 2019-05-07 DIAGNOSIS — I972 Postmastectomy lymphedema syndrome: Secondary | ICD-10-CM | POA: Diagnosis not present

## 2019-05-07 NOTE — Therapy (Signed)
[place], [place], [address] Phone: [phone]   Fax:  [phone]  Physical Therapy Treatment  Patient Details  Name: Gloria Mitchell MRN: [id number] Date of Birth: 1956-04-14 Referring Provider (PT): Dr. Rolm Bookbinder   Encounter Date: 05/07/2019  PT End of Session - 05/07/19 1021    Visit Number  2    Number of Visits  4    Date for PT Re-Evaluation  05/26/19    Authorization Type  Medicaid- sending for auth 04/28/19- auth for 3 visits through 7/28    Authorization - Visit Number  1    Authorization - Number of Visits  3    PT Start Time  0930    PT Stop Time  1015    PT Time Calculation (min)  45 min    Activity Tolerance  Patient tolerated treatment well    Behavior During Therapy  [facility] for tasks assessed/performed       Past Medical History:  Diagnosis Date  . Cancer ([place]) 08/29/2007   Left DICS, stage 2  . Hypertension     Past Surgical History:  Procedure Laterality Date  . BREAST LUMPECTOMY Left 09/09/2007  . CYST REMOVAL NECK    . MASTECTOMY W/ SENTINEL NODE BIOPSY Bilateral 01/08/2019   Procedure: BILATERAL TOTAL MASTECTOMIES WITH RIGHT AXILLARY SENTINEL LYMPH NODE BIOPSY;  Surgeon: Rolm Bookbinder, MD;  Location: [place];  Service: General;  Laterality: Bilateral;    There were no vitals filed for this visit.  Subjective Assessment - 05/07/19 0932    Subjective  I wear the chip pack all the time and I can not tell a difference. I have been doing the massage but I don't see a difference.    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 It measures 4.3 cm and is located in the upper outer quadrant. It is ER/PR positive, HER2 negative with a Ki67 of 10%. There is a place on the left breast that is likely scar tissue from previous breast surgery that measures 6 mm and is located in the upper outer quadrant. She  has a previous left breast cancer history in 2008 and underwent a left lumpectomy, sentinel node biopsy, radiation, and 5 years of Tamoxifen.    Patient Stated Goals  to get rid of the swelling    Currently in Pain?  No/denies    Multiple Pain Sites  No                  Outpatient Re[facility]  Lymphedema Life Impact Scale Total Score  1.47 %           [facility] Adult PT Treatment/Exercise - 05/07/19 0001      Manual Therapy   Manual Lymphatic Drainage (MLD)  supine: 5 diaphragmatic breaths, short neck, right inguinal nodes and establishment of axillo inguinal pathway then to left sidelying to work on R lateral trunk in area of increased swelling moving fluid towards pathway then finishing with R inguinal nodes                  PT Long Term Goals - 04/28/19 1156      PT LONG TERM GOAL #1   Title  Pt will be independent in self MLD for long term management of right lateral trunk edema    Time  4    Period  Weeks  Status  New    Target Date  05/26/19      PT LONG TERM GOAL #2   Title  Pt will have appropriate compression garments for long term managemet of lymphedema    Time  4    Period  Weeks    Status  New    Target Date  05/26/19            Plan - 05/07/19 1021    Clinical Impression Statement  Pt has been practicing the self MLD at home but can not see any improvement. She has been wearing her chip pack constantly. Spent the session focusing on MLD to right lateral trunk to decrease lymphedema. Following session some improvement of swelling could be visualized.    Stability/Clinical Decision Making  Stable/Uncomplicated    Rehab Potential  Excellent    Clinical Impairments Affecting Rehab Potential  None    PT Frequency  Other (comment)   3 visits per Josem Kaufmann   PT Duration  4 weeks    PT Treatment/Interventions  ADLs/Self Care Home Management;Patient/family education;Therapeutic  exercise;Manual lymph drainage;Taping;Compression bandaging    PT Next Visit Plan  continue instructing in MLD for R lateral trunk and issue handout, give pec stretch- lying over foam roll    PT Home Exercise Plan  self MLD and chip pack    Consulted and Agree with Plan of Care  Patient       Patient will benefit from skilled therapeutic intervention in order to improve the following deficits and impairments:  Increased edema, Increased fascial restricitons  Visit Diagnosis: 1. Postmastectomy lymphedema        Problem List Patient Active Problem List   Diagnosis Date Noted  . Malignant neoplasm of upper-outer quadrant of right breast in female, estrogen receptor positive ([place]) 12/08/2018    Allyson Sabal [facility] 05/07/2019, 10:25 AM  [place], [place], [address] Phone: [phone]   Fax:  [phone]  Name: Gloria Mitchell MRN: [id number] Date of Birth: 07-Sep-1956  Manus Gunning, PT 05/07/19 10:25 AM

## 2019-05-14 ENCOUNTER — Encounter: Payer: Medicaid Other | Admitting: Physical Therapy

## 2019-05-14 ENCOUNTER — Encounter

## 2019-05-18 ENCOUNTER — Ambulatory Visit: Payer: Medicaid Other | Admitting: Oncology

## 2019-05-21 ENCOUNTER — Ambulatory Visit: Payer: Medicaid Other | Admitting: Physical Therapy

## 2019-05-21 ENCOUNTER — Encounter: Payer: Self-pay | Admitting: Physical Therapy

## 2019-05-21 ENCOUNTER — Other Ambulatory Visit: Payer: Self-pay

## 2019-05-21 DIAGNOSIS — I972 Postmastectomy lymphedema syndrome: Secondary | ICD-10-CM

## 2019-05-21 NOTE — Therapy (Signed)
[place], [place], [address] Phone: [phone]   Fax:  [phone]  Physical Therapy Treatment  Patient Details  Name: Gloria Mitchell MRN: [id number] Date of Birth: [birth year] Referring Provider (PT): Dr. Rolm Bookbinder   Encounter Date: 05/21/2019  PT End of Session - 05/21/19 1020    Visit Number  3    Number of Visits  4    Date for PT Re-Evaluation  05/26/19    Authorization Type  Medicaid- sending for auth 04/28/19- auth for 3 visits through 7/28    Authorization - Visit Number  2    Authorization - Number of Visits  3    PT Start Time  0931    PT Stop Time  1017    PT Time Calculation (min)  46 min    Activity Tolerance  Patient tolerated treatment well    Behavior During Therapy  [facility] for tasks assessed/performed       Past Medical History:  Diagnosis Date  . Cancer ([place]) 08/29/2007   Left DICS, stage 2  . Hypertension     Past Surgical History:  Procedure Laterality Date  . BREAST LUMPECTOMY Left 09/09/2007  . CYST REMOVAL NECK    . MASTECTOMY W/ SENTINEL NODE BIOPSY Bilateral 01/08/2019   Procedure: BILATERAL TOTAL MASTECTOMIES WITH RIGHT AXILLARY SENTINEL LYMPH NODE BIOPSY;  Surgeon: Rolm Bookbinder, MD;  Location: [place];  Service: General;  Laterality: Bilateral;    There were no vitals filed for this visit.  Subjective Assessment - 05/21/19 0933    Subjective  I think you made it better. I have been trying to massage.    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 It measures 4.3 cm and is located in the upper outer quadrant. It is ER/PR positive, HER2 negative with a Ki67 of 10%. There is a place on the left breast that is likely scar tissue from previous breast surgery that measures 6 mm and is located in the upper outer quadrant. She has a previous left breast cancer history in 2008 and underwent a  left lumpectomy, sentinel node biopsy, radiation, and 5 years of Tamoxifen.    Patient Stated Goals  to get rid of the swelling    Currently in Pain?  No/denies                  Outpatient Rehab from [facility]28/2019 [facility]  Lymphedema Life Impact Scale Total Score  1.47 %           [facility] Adult PT Treatment/Exercise - 05/21/19 0001      Manual Therapy   Manual Lymphatic Drainage (MLD)  supine: 5 diaphragmatic breaths, short neck, right inguinal nodes and establishment of axillo inguinal pathway then to left sidelying to work on R lateral trunk in area of increased swelling moving fluid towards pathway then finishing with R inguinal nodes                  PT Long Term Goals - 04/28/19 0921      PT LONG TERM GOAL #1   Title  Pt will be independent in self MLD for long term management of right lateral trunk edema    Time  4    Period  Weeks    Status  New    Target Date  05/26/19      PT LONG TERM GOAL #2  Title  Pt will have appropriate compression garments for long term managemet of lymphedema    Time  4    Period  Weeks    Status  New    Target Date  05/26/19            Plan - 05/21/19 1020    Clinical Impression Statement  Continued with MLD to R lateral trunk. Pt has noticed improvement in this area since the MLD last session. She has been trying to do the self massage at home and can tell the swelling is improving. Issued a handout to pt today with instructions on self MLD.    Stability/Clinical Decision Making  Stable/Uncomplicated    Rehab Potential  Excellent    Clinical Impairments Affecting Rehab Potential  None    PT Frequency  Other (comment)   3 visits per Josem Kaufmann   PT Duration  4 weeks    PT Treatment/Interventions  ADLs/Self Care Home Management;Patient/family education;Therapeutic exercise;Manual lymph drainage;Taping;Compression bandaging    PT Next Visit Plan  continue MLD to R lateral trunk,  either recert or d/c, give pec stretch on foam roll    PT Home Exercise Plan  self MLD and chip pack    Consulted and Agree with Plan of Care  Patient       Patient will benefit from skilled therapeutic intervention in order to improve the following deficits and impairments:  Increased edema, Increased fascial restricitons  Visit Diagnosis: 1. Postmastectomy lymphedema        Problem List Patient Active Problem List   Diagnosis Date Noted  . Malignant neoplasm of upper-outer quadrant of right breast in female, estrogen receptor positive ([place]) 12/08/2018    Allyson Sabal [facility] 05/21/2019, 10:22 AM  [place], [place], [address] Phone: [phone]   Fax:  [phone]  Name: Gloria Mitchell MRN: [id number] Date of Birth: 03/02/1956  Manus Gunning, PT 05/21/19 10:22 AM

## 2019-05-21 NOTE — Patient Instructions (Signed)
Self manual lymph drainage: Perform this sequence once a day.  Only give enough pressure no your skin to make the skin move.  Diaphragmatic - Supine   Inhale through nose making navel move out toward hands. Exhale through puckered lips, hands follow navel in. Repeat _5__ times. Rest _10__ seconds between repeats.   Copyright  VHI. All rights reserved.  Hug yourself.  Do circles at your neck just above your collarbones.  Repeat this 10 times.   Copyright  VHI. All rights reserved.  LEG: Inguinal Nodes Stimulation   With small finger side of hand against hip crease on involved side, gently perform circles at the crease. Repeat __10_ times.   Copyright  VHI. All rights reserved.  Axilla to Inguinal Nodes - Sweep   On involved side, stretch skin downwards from armpit along side of trunk to hip crease for about 15 minutes.   Repeat the steps above where you do circles in your right groin. Copyright  VHI. All rights reserved.

## 2019-05-26 ENCOUNTER — Encounter: Payer: Self-pay | Admitting: Physical Therapy

## 2019-05-26 ENCOUNTER — Other Ambulatory Visit: Payer: Self-pay

## 2019-05-26 ENCOUNTER — Ambulatory Visit: Payer: Medicaid Other | Admitting: Physical Therapy

## 2019-05-26 DIAGNOSIS — I972 Postmastectomy lymphedema syndrome: Secondary | ICD-10-CM | POA: Diagnosis not present

## 2019-05-26 NOTE — Therapy (Signed)
[place], [place], [address] Phone: [phone]   Fax:  [phone]  Physical Therapy Treatment  Patient Details  Name: Gloria Mitchell MRN: [id number] Date of Birth: 1956-04-02 Referring Provider (PT): Dr. Rolm Bookbinder   Encounter Date: 05/26/2019  PT End of Session - 05/26/19 0921    Visit Number  4    Number of Visits  4    Date for PT Re-Evaluation  05/26/19    Authorization Type  Medicaid- sending for auth 04/28/19- auth for 3 visits through 7/28    Authorization - Visit Number  3    Authorization - Number of Visits  3    PT Start Time  0833    PT Stop Time  0915    PT Time Calculation (min)  42 min    Activity Tolerance  Patient tolerated treatment well    Behavior During Therapy  [facility] for tasks assessed/performed       Past Medical History:  Diagnosis Date  . Cancer ([place]) 08/29/2007   Left DICS, stage 2  . Hypertension     Past Surgical History:  Procedure Laterality Date  . BREAST LUMPECTOMY Left 09/09/2007  . CYST REMOVAL NECK    . MASTECTOMY W/ SENTINEL NODE BIOPSY Bilateral 01/08/2019   Procedure: BILATERAL TOTAL MASTECTOMIES WITH RIGHT AXILLARY SENTINEL LYMPH NODE BIOPSY;  Surgeon: Rolm Bookbinder, MD;  Location: [place];  Service: General;  Laterality: Bilateral;    There were no vitals filed for this visit.  Subjective Assessment - 05/26/19 0835    Subjective  I am trying to get the swelling down. I have been doing the massage twice a day. I am seeing a difference. I do not feel a roll when I put my arm down.    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 It measures 4.3 cm and is located in the upper outer quadrant. It is ER/PR positive, HER2 negative with a Ki67 of 10%. There is a place on the left breast that is likely scar tissue from previous breast surgery that measures 6 mm and is located in the  upper outer quadrant. She has a previous left breast cancer history in 2008 and underwent a left lumpectomy, sentinel node biopsy, radiation, and 5 years of Tamoxifen.    Patient Stated Goals  to get rid of the swelling    Currently in Pain?  No/denies                  Outpatient Re[facility]/28/[facility]  Lymphedema Life Impact Scale Total Score  1.47 %           [facility] Adult PT Treatment/Exercise - 05/26/19 0001      Manual Therapy   Manual Lymphatic Drainage (MLD)  supine: short neck, superficial and deep abdominals, right inguinal nodes and establishment of axillo inguinal pathway then to left sidelying to work on R lateral trunk in area of increased swelling moving fluid towards pathway then finishing with R inguinal nodes                  PT Long Term Goals - 05/26/19 0836      PT LONG TERM GOAL #1   Title  Pt will be independent in self MLD for long term management of right lateral trunk edema    Baseline  05/26/19- pt is indepedent with this  Time  4    Period  Weeks    Status  Achieved      PT LONG TERM GOAL #2   Title  Pt will have appropriate compression garments for long term managemet of lymphedema    Baseline  05/26/19- I received the compression bras, I wear the chip pack    Time  4    Period  Weeks    Status  Achieved            Plan - 05/26/19 6160    Clinical Impression Statement  Continued with MLD to R lateral trunk. Pt has now met all goals for therapy and able to independently manage her lymphedema at home. She is independent in self MLD and does it twice daily. She has met all goals for therapy and will be discharged at this time.    Stability/Clinical Decision Making  Stable/Uncomplicated    Rehab Potential  Excellent    Clinical Impairments Affecting Rehab Potential  None    PT Frequency  Other (comment)   3 visits per Josem Kaufmann   PT Duration  4 weeks    PT Treatment/Interventions   ADLs/Self Care Home Management;Patient/family education;Therapeutic exercise;Manual lymph drainage;Taping;Compression bandaging    PT Next Visit Plan  d/c this visit    PT Home Exercise Plan  self MLD and chip pack    Consulted and Agree with Plan of Care  Patient       Patient will benefit from skilled therapeutic intervention in order to improve the following deficits and impairments:  Increased edema, Increased fascial restricitons  Visit Diagnosis: 1. Postmastectomy lymphedema        Problem List Patient Active Problem List   Diagnosis Date Noted  . Malignant neoplasm of upper-outer quadrant of right breast in female, estrogen receptor positive ([place]) 12/08/2018    Allyson Sabal [facility] 05/26/2019, 9:24 AM  [place], [place], [address] Phone: [phone]   Fax:  [phone]  Name: Gloria Mitchell MRN: [id number] Date of Birth: 1956/07/20  PHYSICAL THERAPY DISCHARGE SUMMARY  Visits from Start of Care: 4  Current functional level related to goals / functional outcomes: All goals met   Remaining deficits: none   Education / Equipment: Self MLD, compression garments  Plan: Patient agrees to discharge.  Patient goals were met. Patient is being discharged due to meeting the stated rehab goals.  ?????     Allyson Sabal [place], [place] 05/26/19 9:24 AM

## 2019-05-28 ENCOUNTER — Telehealth: Payer: Self-pay | Admitting: Oncology

## 2019-05-28 NOTE — Telephone Encounter (Signed)
Called patient regarding upcoming Webex appointment, patient is notified and e-mail has been sent. °

## 2019-05-28 NOTE — Telephone Encounter (Signed)
Confirmed appt and verified info. °

## 2019-05-28 NOTE — Progress Notes (Signed)
[name]  Telephone:([phone] Fax:([phone]     ID: Gloria Mitchell DOB: 27-Oct-1956  MR#: [id number]  MWN#:[id number]  Patient Care Team: Fanny Bien, MD as PCP - General (Family Medicine) Rolm Bookbinder, MD as Consulting Physician (General Surgery) Magrinat, [name], MD as Consulting Physician (Oncology) [name], MD as Attending Physician (Radiation Oncology) Rockwell Germany, RN as Oncology Nurse Navigator Mauro Kaufmann, RN as Oncology Nurse Navigator OTHER MD:  I connected with Siobahn Worsley on 05/29/19 at  9:45 AM EDT by telephone visit and verified that I am speaking with the correct person using two identifiers.   I discussed the limitations, risks, security and privacy concerns of performing an evaluation and management service by telemedicine and the availability of in-person appointments. I also discussed with the patient that there may be a patient responsible charge related to this service. The patient expressed understanding and agreed to proceed.   Other persons participating in the visit and their role in the encounter: Biomedical scientist, scribe   Patients location: home  Providers location: [place]: Estrogen receptor positive breast cancer   CURRENT TREATMENT: Anastrozole   INTERVAL HISTORY: Gloria Mitchell is contacted today for follow up of her estrogen receptor positive breast cancer.  She continues on anastrozole with good tolerance. She continues to have hot flashes that are similar to when she was first going through menopause. She also has some stiffness in the mornings, but it goes away after she gets up and moves around.  Since her last visit, she has not undergone any additional studies. She gets her mammograms in January at [facility].   REVIEW OF SYSTEMS: Kaneesha notes that she finished with physical therapy on 05/26/2019. For exercise, she is walking around a half mile per  day. Dorlene notes that her mom has been placed on hospice, but that she is still at home; Lamaria is actively taking care of her. A detailed review of systems was otherwise entirely negative.   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   More recently she presented with a palpable abnormality in the upper-outer right breast and in the upper-central left periareolar region. She underwent bilateral diagnostic mammography with tomography and bilateral breast ultrasonography at [facility] on 11/21/2018 showing: Breast Density Category C. In the right breast, examination demonstrates an oval circumscribed 4.4 cm mass over the upper outer region accounting for patient's palpable abnormality. Remainder of the right breast in unchanged. On physical exam, there is an oval 3.0 x 4.0 cm firm mobile mass over the 9:30 position of the right breast 7 cm from the nipple. Target ultrasound is performed, showing an oval predominately circumscribed isoechoic mass with parallel long axis at the 9:30 position of the right breast 7 cm from the nipple corresponding to patient's mammographic and palpable abnormality. This measures 1.9 x 3.3 x 4.3 cm. There is mild to moderate internal vascularity. Ultrasound of the right axilla is unremarkable.   Accordingly on 12/01/2018 she proceeded to biopsy of the right breast area in question. The pathology from this procedure showed ([id number]): invasive ductal carcinoma, grade 1-2. Prognostic indicators significant for: estrogen receptor, 100% positive and progesterone receptor, 100% positive, both with strong staining intensity. Proliferation marker Ki67 at 10%. HER2 equivocal (2+) by immunohistochemistry  but negative by fluorescent in situ hybridization with a signals ratio 1.23 and number per cell 1.85.   The  same mammogram showed no definite focal abnormality over the upper central left periareolar region to correspond to patient's palpable abnormality. There are stable post lumpectomy changes over the upper-outer left breast. No focal abnormality over the upper central left periareolar region is palpated on physical exam. Ultrasound over the upper central left periareolar region demonstrates an oval hypoechoic mass at the 1 o'clock position 1 cm from the nipple measuring 0.4 x 0.6 x 0.6 cm with possible intraductal extension. There is subtle border irregularity. Ultrasound of the left axilla is normal.   She also proceeded to biopsy of the left breast area in question on the same day. The pathology from this procedure showed ([id number]): fibrocystic changes to include stromal fibrosis.    The patient's subsequent history is as detailed below.   PAST MEDICAL HISTORY: Past Medical History:  Diagnosis Date   Cancer ([place]) 08/29/2007   Left DICS, stage 2   Hypertension     PAST SURGICAL HISTORY: Past Surgical History:  Procedure Laterality Date   BREAST LUMPECTOMY Left 09/09/2007   CYST REMOVAL NECK     MASTECTOMY W/ SENTINEL NODE BIOPSY Bilateral 01/08/2019   Procedure: BILATERAL TOTAL MASTECTOMIES WITH RIGHT AXILLARY SENTINEL LYMPH NODE BIOPSY;  Surgeon: Rolm Bookbinder, MD;  Location: [place];  Service: General;  Laterality: Bilateral;    FAMILY HISTORY: Family History  Problem Relation Age of Onset   Hypertension Mother    Dementia Mother    Breast cancer Mother        diagnosed in her 77's   Breast cancer Maternal Aunt    As of February 2020, Gloria Mitchell's father is alive, but she is estranged from him. [name] has no information about her father or his family's medical information. Patients' mother is 81. The patient has 1 brother and 1 sister. The patient has a maternal aunt that was diagnosed with breast cancer at 64 years old. Patient denies anyone in her family having ovarian,  prostate, or pancreatic cancer.    GYNECOLOGIC HISTORY:  No LMP recorded. Patient is postmenopausal. 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 She is widowed. She has no children. Sharlyne lives with her mother who at the time of our visit in July was under hospice care. Bryleigh does not attend a church, Glass blower/designer, or mosque.   ADVANCED DIRECTIVES: Not in place. Kiyla was given the appropriate health care power of attorney forms to fill out and return at her convenience.     HEALTH MAINTENANCE: Social History   Tobacco Use   Smoking status: Never Smoker   Smokeless tobacco: Never Used  Substance Use Topics   Alcohol use: Not Currently   Drug use: Never    Colonoscopy: yes, 12 years ago in [place]  PAP: yes, 11/20/2018  Bone density: yes, roughly 2010, osteopenia per patient   Allergies  Allergen Reactions   Sulfa Antibiotics Anaphylaxis and Hives    Current Outpatient Medications  Medication Sig Dispense Refill   anastrozole (ARIMIDEX) 1 MG tablet Take 1 tablet (1 mg total) by mouth daily. 90 tablet 4   diphenhydrAMINE (BENADRYL) 25 mg capsule Take 25 mg by mouth every 8 (eight) hours as needed for allergies.      esomeprazole (NEXIUM) 20 MG capsule  Take 1 capsule (20 mg total) by mouth daily at 12 noon.     hydrochlorothiazide (MICROZIDE) 12.5 MG capsule Take 12.5 mg by mouth every Monday, Wednesday, and Friday.      lisinopril (PRINIVIL,ZESTRIL) 20 MG tablet Take 20 mg by mouth daily.      No current facility-administered medications for this visit.      OBJECTIVE: Middle-aged white woman in no acute distress  There were no vitals filed for this visit.   There is no height or weight on file to calculate BMI.   Wt Readings from Last 3 Encounters:  01/08/19 167 lb (75.8 kg)  12/29/18 166 lb 14.4 oz (75.7 kg)    12/10/18 168 lb 3.2 oz (76.3 kg)      ECOG FS:1 - Symptomatic but completely ambulatory  Telehealth visit   LAB RESULTS:  CMP     Component Value Date/Time   NA 139 12/29/2018 0850   K 4.0 12/29/2018 0850   CL 105 12/29/2018 0850   CO2 27 12/29/2018 0850   GLUCOSE 111 (H) 12/29/2018 0850   BUN 13 12/29/2018 0850   CREATININE 0.88 12/29/2018 0850   CREATININE 0.88 12/10/2018 0820   CALCIUM 9.5 12/29/2018 0850   PROT 7.2 12/10/2018 0820   ALBUMIN 3.9 12/10/2018 0820   AST 13 (L) 12/10/2018 0820   ALT 8 12/10/2018 0820   ALKPHOS 64 12/10/2018 0820   BILITOT 0.3 12/10/2018 0820   GFRNONAA >60 12/29/2018 0850   GFRNONAA >60 12/10/2018 0820   GFRAA >60 12/29/2018 0850   GFRAA >60 12/10/2018 0820    No results found for: TOTALPROTELP, ALBUMINELP, A1GS, A2GS, BETS, BETA2SER, GAMS, MSPIKE, SPEI  No results found for: KPAFRELGTCHN, LAMBDASER, KAPLAMBRATIO  Lab Results  Component Value Date   WBC 6.4 12/29/2018   NEUTROABS 3.1 12/10/2018   HGB 12.8 12/29/2018   HCT 40.8 12/29/2018   MCV 92.9 12/29/2018   PLT 284 12/29/2018    No results found for: LABCA2  No components found for: TGYBWL893  No results for input(s): INR in the last 168 hours.  No results found for: LABCA2  No results found for: TDS287  No results found for: GOT157  No results found for: WIO035  No results found for: CA2729  No components found for: HGQUANT  No results found for: CEA1 / No results found for: CEA1   No results found for: AFPTUMOR  No results found for: CHROMOGRNA  No results found for: PSA1  No visits with results within 3 Day(s) from this visit.  Latest known visit with results is:  Hospital Outpatient Visit on 12/29/2018  Component Date Value Ref Range Status   WBC 12/29/2018 6.4  4.0 - 10.5 K/uL Final   RBC 12/29/2018 4.39  3.87 - 5.11 MIL/uL Final   Hemoglobin 12/29/2018 12.8  12.0 - 15.0 g/dL Final   HCT 12/29/2018 40.8  36.0 - 46.0 % Final   MCV  12/29/2018 92.9  80.0 - 100.0 fL Final   MCH 12/29/2018 29.2  26.0 - 34.0 pg Final   MCHC 12/29/2018 31.4  30.0 - 36.0 g/dL Final   RDW 12/29/2018 12.9  11.5 - 15.5 % Final   Platelets 12/29/2018 284  150 - 400 K/uL Final   nRBC 12/29/2018 0.0  0.0 - 0.2 % Final   Performed at [facility], [place] [address], [place], [place] [address]   Sodium 12/29/2018 139  135 - 145 mmol/L Final   Potassium 12/29/2018 4.0  3.5 - 5.1  mmol/L Final   Chloride 12/29/2018 105  98 - 111 mmol/L Final   CO2 12/29/2018 27  22 - 32 mmol/L Final   Glucose, Bld 12/29/2018 111* 70 - 99 mg/dL Final   BUN 12/29/2018 13  8 - 23 mg/dL Final   Creatinine, Ser 12/29/2018 0.88  0.44 - 1.00 mg/dL Final   Calcium 12/29/2018 9.5  8.9 - 10.3 mg/dL Final   GFR calc non Af Amer 12/29/2018 >60  >60 mL/min Final   GFR calc Af Amer 12/29/2018 >60  >60 mL/min Final   Anion gap 12/29/2018 7  5 - 15 Final   Performed at [facility], [place] [address], [place], [place] [address]    (this displays the last labs from the last 3 days)  No results found for: TOTALPROTELP, ALBUMINELP, A1GS, A2GS, BETS, BETA2SER, GAMS, MSPIKE, SPEI (this displays SPEP labs)  No results found for: KPAFRELGTCHN, LAMBDASER, KAPLAMBRATIO (kappa/lambda light chains)  No results found for: HGBA, HGBA2QUANT, HGBFQUANT, HGBSQUAN (Hemoglobinopathy evaluation)   No results found for: LDH  No results found for: IRON, TIBC, IRONPCTSAT (Iron and TIBC)  No results found for: FERRITIN  Urinalysis No results found for: COLORURINE, APPEARANCEUR, LABSPEC, PHURINE, GLUCOSEU, HGBUR, BILIRUBINUR, KETONESUR, PROTEINUR, UROBILINOGEN, NITRITE, LEUKOCYTESUR   STUDIES:  No results found.   ELIGIBLE FOR AVAILABLE RESEARCH PROTOCOL: NO   ASSESSMENT: 63 y.o. 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   (1) status post bilateral mastectomies and right sentinel lymph node sampling 01/08/2019 showing:  (a) on the left, atypical lobular hyperplasia  (b) on the right, a T2 N0, stage IB invasive micropapillary carcinoma, grade 2, with negative margins; 2 right-sided sentinel lymph nodes sampled  (2) Oncotype score of 11 predicts a risk of recurrence outside the breast within the next 9 years of 3% if the patient only takes antiestrogens for 5 years, with no benefit from chemotherapy.  (3) adjuvant radiation not indicated  (4) anastrozole started 01/26/2019  (5) genetics testing pending   PLAN:  Dhara is now just about 1/2-year out from definitive surgery for her breast cancer with no evidence of disease recurrence or disease activity.  This is favorable.  She is tolerating anastrozole well and the plan will be to continue that a total of 5 years.  She has an excellent walking program.  She would benefit from taking vitamin D in addition and I have added that to her medications.  After her next visit here we will set her up for a bone density which will serve as baseline.  We will also discussed genetics testing at that time  I am sorry her mother is currently under hospice care  Rox knows to call for any other issue that may develop before the next visit.     Mallissa Lorenzen, [name], MD  05/29/19 9:59 AM Medical Oncology and Hematology [facility] [address] [place], [place] [address] Tel. [phone]    Fax. [phone]    I, Jacqualyn Posey am acting as a Education administrator for Chauncey Cruel, MD.   I, Lurline Del MD, have reviewed the above documentation for accuracy and completeness, and I agree with  the above.

## 2019-05-29 ENCOUNTER — Inpatient Hospital Stay: Payer: Medicaid Other | Attending: Oncology | Admitting: Oncology

## 2019-05-29 DIAGNOSIS — R600 Localized edema: Secondary | ICD-10-CM | POA: Insufficient documentation

## 2019-05-29 DIAGNOSIS — Z170 Estrogen receptor positive status [ER+]: Secondary | ICD-10-CM | POA: Diagnosis not present

## 2019-05-29 DIAGNOSIS — C50411 Malignant neoplasm of upper-outer quadrant of right female breast: Secondary | ICD-10-CM | POA: Diagnosis not present

## 2019-05-29 DIAGNOSIS — Z79811 Long term (current) use of aromatase inhibitors: Secondary | ICD-10-CM | POA: Diagnosis not present

## 2019-05-29 DIAGNOSIS — C539 Malignant neoplasm of cervix uteri, unspecified: Secondary | ICD-10-CM | POA: Insufficient documentation

## 2019-05-29 DIAGNOSIS — M79605 Pain in left leg: Secondary | ICD-10-CM | POA: Insufficient documentation

## 2019-05-29 DIAGNOSIS — I10 Essential (primary) hypertension: Secondary | ICD-10-CM

## 2019-05-29 DIAGNOSIS — D649 Anemia, unspecified: Secondary | ICD-10-CM | POA: Diagnosis not present

## 2019-05-29 DIAGNOSIS — N179 Acute kidney failure, unspecified: Secondary | ICD-10-CM | POA: Insufficient documentation

## 2019-05-29 MED ORDER — ANASTROZOLE 1 MG PO TABS: 1.0000 mg | ORAL_TABLET | Freq: Every day | ORAL | 4 refills | Status: DC

## 2019-05-29 MED ORDER — VITAMIN D 25 MCG (1000 UNIT) PO TABS: 1000.0000 [IU] | ORAL_TABLET | Freq: Every day | ORAL | 4 refills | Status: AC

## 2019-06-02 ENCOUNTER — Telehealth: Payer: Self-pay | Admitting: Oncology

## 2019-06-02 NOTE — Telephone Encounter (Signed)
I left a message regarding schedule  

## 2019-06-29 ENCOUNTER — Telehealth: Payer: Self-pay | Admitting: Adult Health

## 2019-06-29 NOTE — Telephone Encounter (Signed)
When I called about converting appointment to my chart, patient said she did not know what this appointment was about and she didn't know why she had it and wanted to cancel.

## 2019-07-13 ENCOUNTER — Encounter: Payer: Medicaid Other | Admitting: Adult Health

## 2019-08-02 IMAGING — [facility] DIGITAL DIAGNOSTIC BILATERAL MAMMOGRAM WITH TOMO AND CAD
6 of 12 series · 6 of 36 positions shown · non-contrast
Comparison: Previous exam(s).

CLINICAL DATA: Patient presents for a bilateral diagnostic
examination due to a palpable abnormality for 1-2 months over the
upper-outer right breast. Patient's healthcare provider states a
palpable abnormality over the upper central left periareolar region.
Positive family history breast cancer in her mother diagnosed in her
40s as well as maternal aunt. No genetic testing done previously.

EXAM:
DIGITAL DIAGNOSTIC bilateral MAMMOGRAM WITH CAD AND TOMO
ULTRASOUND bilateral BREAST

[L MLO synth-2D (1 of 2)]
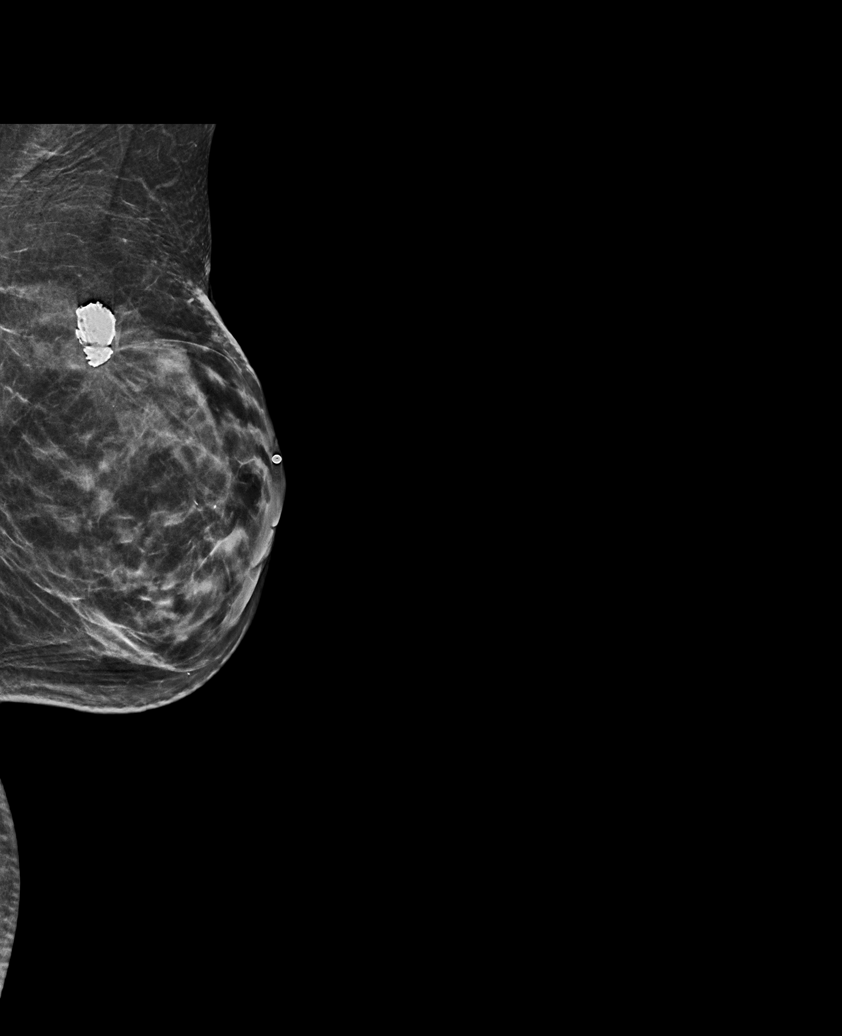

[R CC synth-2D (1 of 2)]
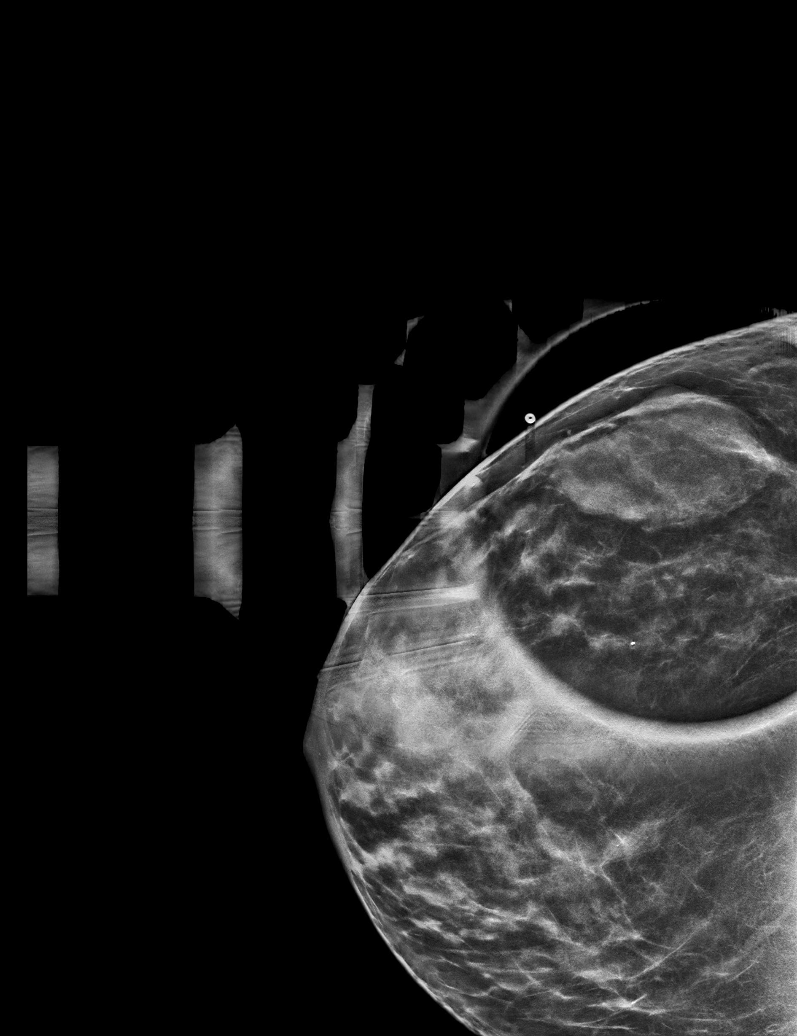

[L CC synth-2D]
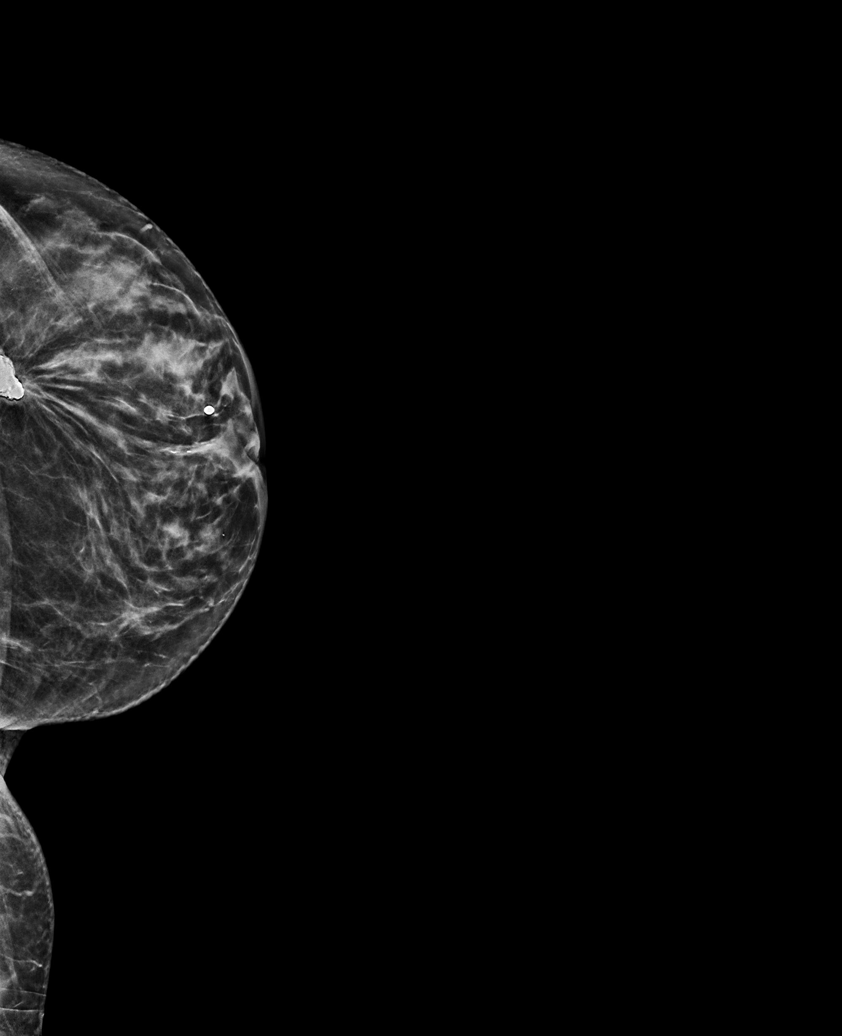

[R MLO synth-2D]
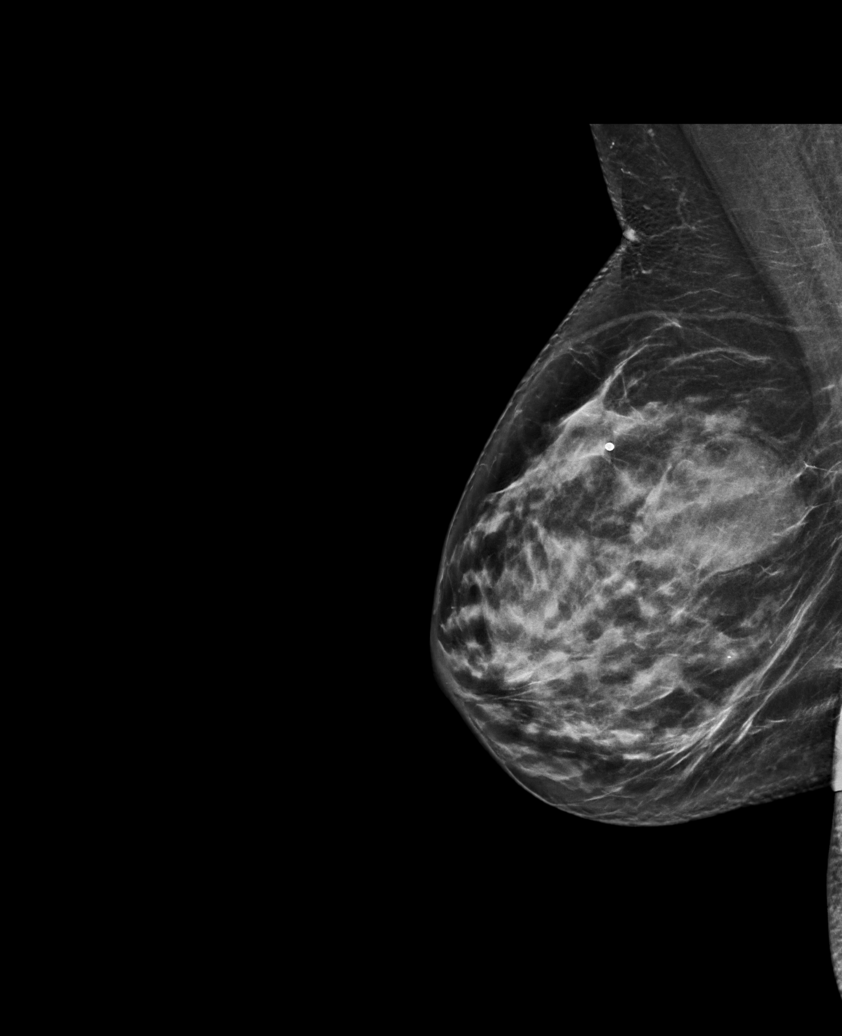

[L MLO synth-2D (2 of 2)]
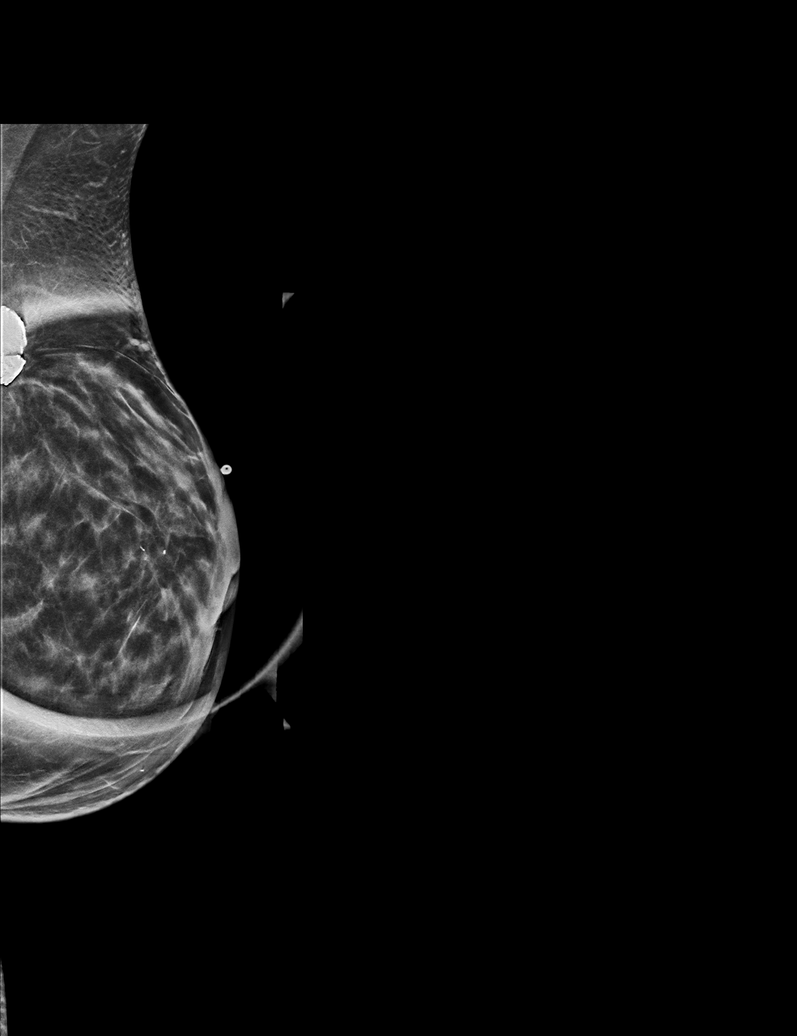

[R CC synth-2D (2 of 2)]
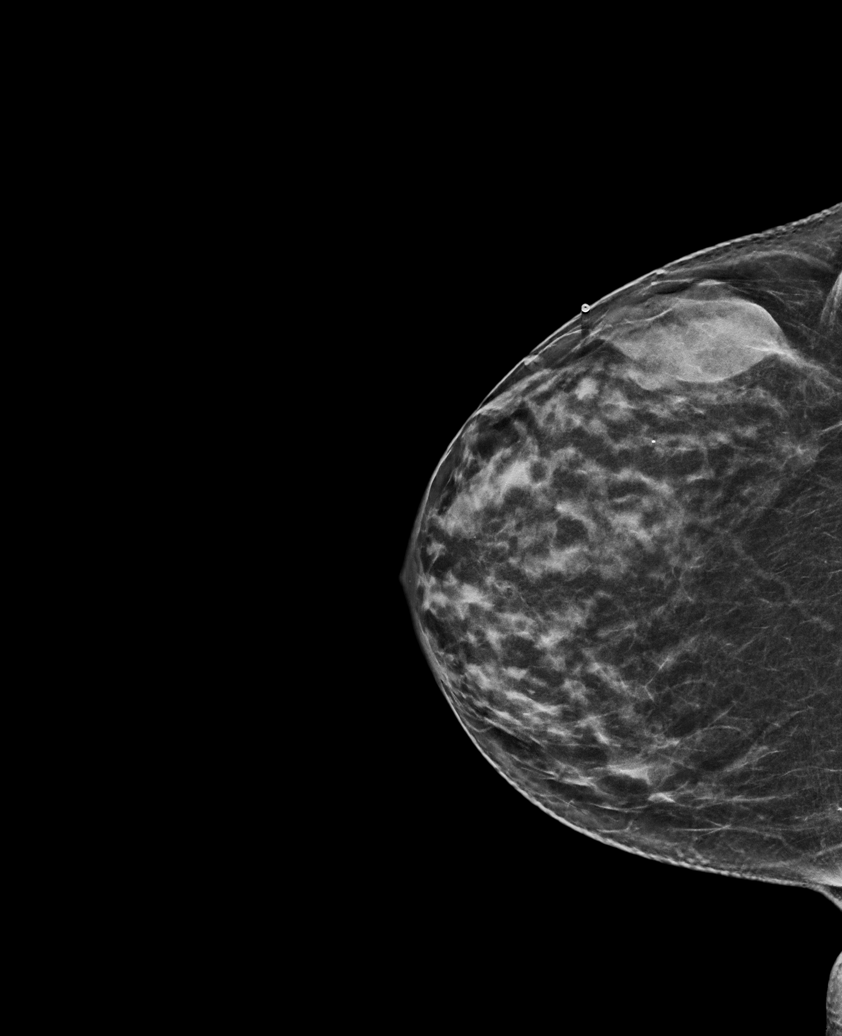

[6 of 36 positions shown; findings below may reference images not displayed]

ACR Breast Density Category c: The breast tissue is heterogeneously
dense, which may obscure small masses.
FINDINGS: Examination demonstrates an oval circumscribed 4.4 cm mass over the
upper outer right breast accounting for patient's palpable
abnormality. Remainder of the right breast is unchanged. There is no
definite focal abnormality over the upper central left periareolar
region to correspond to patient's palpable abnormality. There are
stable post lumpectomy changes over the upper-outer left breast.

Mammographic images were processed with CAD.

On physical exam, there is an oval 3 x 4 cm firm mobile mass over
the [DATE] position of the right breast 7 cm from the nipple. I
palpate no focal abnormality over the upper central left periareolar
region.

Targeted ultrasound is performed, showing an oval predominately
circumscribed isoechoic mass with parallel long axis at the [DATE]
position of the right breast 7 cm from the nipple corresponding to
patient's mammographic and palpable abnormality. This measures 1.9 x
3.3 x 4.3 cm. There is mild to moderate internal vascularity.

Ultrasound the right axilla is unremarkable.

Ultrasound over the upper central left periareolar region
demonstrates an oval hypoechoic mass at the 1 o'clock position 1 cm
from the nipple measuring 4 x 6 x 6 mm with possible intraductal
extension. There is subtle border irregularity.

Ultrasound of the left axilla is normal.
IMPRESSION: 1. Indeterminate 4.3 cm mass over the [DATE] position of the right
breast 7 cm from the nipple corresponding to patient's palpable
abnormality.

2. Indeterminate 6 mm mass over the 1 o'clock position of the left
breast 1 cm from the nipple.

RECOMMENDATION:
Recommend ultrasound-guided core needle biopsy of these bilateral
indeterminate masses.

I have discussed the findings and recommendations with the patient.
Results were also provided in writing at the conclusion of the
visit. If applicable, a reminder letter will be sent to the patient
regarding the next appointment.

BI-RADS CATEGORY  4: Suspicious.

Biopsy will be scheduled here at the [REDACTED] prior to
patient's departure.

## 2019-10-06 ENCOUNTER — Encounter: Payer: Self-pay | Admitting: *Deleted

## 2019-11-02 ENCOUNTER — Encounter: Payer: Self-pay | Admitting: *Deleted

## 2020-01-19 ENCOUNTER — Telehealth: Payer: Self-pay | Admitting: Oncology

## 2020-01-19 NOTE — Telephone Encounter (Signed)
Called pt per 3/23 sch message - no answer and no vmail to leave message

## 2020-01-31 NOTE — Progress Notes (Signed)
No show

## 2020-02-01 ENCOUNTER — Inpatient Hospital Stay: Payer: Medicare Other | Attending: Oncology | Admitting: Oncology

## 2020-02-01 ENCOUNTER — Inpatient Hospital Stay: Payer: Medicare Other

## 2020-02-01 ENCOUNTER — Encounter: Payer: Self-pay | Admitting: Oncology

## 2020-02-03 ENCOUNTER — Other Ambulatory Visit: Payer: Self-pay | Admitting: General Surgery

## 2020-02-03 DIAGNOSIS — Z79899 Other long term (current) drug therapy: Secondary | ICD-10-CM

## 2020-07-09 ENCOUNTER — Other Ambulatory Visit: Payer: Self-pay | Admitting: Oncology

## 2021-03-21 ENCOUNTER — Other Ambulatory Visit: Payer: Self-pay | Admitting: General Surgery

## 2021-03-21 DIAGNOSIS — C50411 Malignant neoplasm of upper-outer quadrant of right female breast: Secondary | ICD-10-CM

## 2022-10-09 ENCOUNTER — Other Ambulatory Visit: Payer: Self-pay | Admitting: Nurse Practitioner

## 2022-10-09 DIAGNOSIS — E2839 Other primary ovarian failure: Secondary | ICD-10-CM

## 2023-01-18 ENCOUNTER — Ambulatory Visit
Admission: RE | Admit: 2023-01-18 | Discharge: 2023-01-18 | Disposition: A | Discharge status: Home / Self Care | Payer: Medicare Other | Source: Ambulatory Visit | Attending: Nurse Practitioner | Admitting: Nurse Practitioner

## 2023-01-18 DIAGNOSIS — E2839 Other primary ovarian failure: Secondary | ICD-10-CM

## 2024-08-03 ENCOUNTER — Other Ambulatory Visit: Payer: Self-pay | Admitting: General Surgery

## 2024-08-03 DIAGNOSIS — R222 Localized swelling, mass and lump, trunk: Secondary | ICD-10-CM

## 2024-08-05 ENCOUNTER — Inpatient Hospital Stay: Admission: RE | Admit: 2024-08-05 | Discharge: 2024-08-05 | Attending: General Surgery | Admitting: General Surgery

## 2024-08-05 ENCOUNTER — Other Ambulatory Visit: Payer: Self-pay | Admitting: General Surgery

## 2024-08-05 DIAGNOSIS — R222 Localized swelling, mass and lump, trunk: Secondary | ICD-10-CM

## 2024-08-11 ENCOUNTER — Other Ambulatory Visit: Payer: Self-pay | Admitting: General Surgery

## 2024-08-11 ENCOUNTER — Ambulatory Visit
Admission: RE | Admit: 2024-08-11 | Discharge: 2024-08-11 | Disposition: A | Source: Ambulatory Visit | Attending: General Surgery | Admitting: General Surgery

## 2024-08-11 ENCOUNTER — Ambulatory Visit
Admission: RE | Admit: 2024-08-11 | Discharge: 2024-08-11 | Disposition: A | Discharge status: Home / Self Care | Source: Ambulatory Visit | Attending: General Surgery | Admitting: General Surgery

## 2024-08-11 DIAGNOSIS — R222 Localized swelling, mass and lump, trunk: Secondary | ICD-10-CM

## 2024-08-12 ENCOUNTER — Other Ambulatory Visit: Payer: Self-pay | Admitting: General Surgery

## 2024-08-12 DIAGNOSIS — L720 Epidermal cyst: Secondary | ICD-10-CM

## 2024-11-13 ENCOUNTER — Ambulatory Visit
Admission: RE | Admit: 2024-11-13 | Discharge: 2024-11-13 | Disposition: A | Discharge status: Home / Self Care | Source: Ambulatory Visit | Attending: General Surgery | Admitting: General Surgery

## 2024-11-13 DIAGNOSIS — L720 Epidermal cyst: Secondary | ICD-10-CM

## 2024-11-18 ENCOUNTER — Other Ambulatory Visit: Payer: Self-pay | Admitting: General Surgery

## 2024-11-30 ENCOUNTER — Other Ambulatory Visit: Payer: Self-pay

## 2024-11-30 ENCOUNTER — Encounter (HOSPITAL_BASED_OUTPATIENT_CLINIC_OR_DEPARTMENT_OTHER): Payer: Self-pay | Admitting: General Surgery

## 2024-12-01 ENCOUNTER — Encounter (HOSPITAL_BASED_OUTPATIENT_CLINIC_OR_DEPARTMENT_OTHER)
Admission: RE | Admit: 2024-12-01 | Discharge: 2024-12-01 | Disposition: A | Source: Ambulatory Visit | Attending: General Surgery

## 2024-12-01 DIAGNOSIS — Z01818 Encounter for other preprocedural examination: Secondary | ICD-10-CM | POA: Insufficient documentation

## 2024-12-01 MED ORDER — CHLORHEXIDINE GLUCONATE CLOTH 2 % EX PADS: 6.0000 | MEDICATED_PAD | Freq: Once | CUTANEOUS | Status: AC

## 2024-12-01 MED ORDER — ENSURE PRE-SURGERY PO LIQD: 296.0000 mL | Freq: Once | ORAL | Status: AC

## 2024-12-01 NOTE — Progress Notes (Signed)

## 2024-12-08 ENCOUNTER — Encounter (HOSPITAL_BASED_OUTPATIENT_CLINIC_OR_DEPARTMENT_OTHER): Admission: RE | Payer: Self-pay

## 2024-12-08 ENCOUNTER — Ambulatory Visit (HOSPITAL_BASED_OUTPATIENT_CLINIC_OR_DEPARTMENT_OTHER): Admission: RE | Admit: 2024-12-08 | Admitting: General Surgery

## 2024-12-08 DIAGNOSIS — Z01818 Encounter for other preprocedural examination: Secondary | ICD-10-CM

## 2024-12-08 HISTORY — DX: Gastro-esophageal reflux disease without esophagitis: K21.9

## 2024-12-08 HISTORY — DX: Unspecified asthma, uncomplicated: J45.909
